# Patient Record
Sex: Male | Born: 1965 | Race: White | Hispanic: No | Smoking: Former smoker
Health system: Southern US, Community
[De-identification: ages and names within clinical notes are randomized; demographics above are authoritative.]

## PROBLEM LIST (undated history)

## (undated) DIAGNOSIS — N5089 Other specified disorders of the male genital organs: Secondary | ICD-10-CM

## (undated) DIAGNOSIS — Z973 Presence of spectacles and contact lenses: Secondary | ICD-10-CM

## (undated) DIAGNOSIS — R519 Headache, unspecified: Secondary | ICD-10-CM

## (undated) DIAGNOSIS — G4730 Sleep apnea, unspecified: Secondary | ICD-10-CM

## (undated) DIAGNOSIS — R531 Weakness: Secondary | ICD-10-CM

## (undated) DIAGNOSIS — R51 Headache: Secondary | ICD-10-CM

## (undated) DIAGNOSIS — C801 Malignant (primary) neoplasm, unspecified: Secondary | ICD-10-CM

## (undated) DIAGNOSIS — R011 Cardiac murmur, unspecified: Secondary | ICD-10-CM

## (undated) HISTORY — PX: SHOULDER ARTHROSCOPY W/ ACROMIAL REPAIR: SUR94

## (undated) HISTORY — PX: CERVICAL SPINE SURGERY: SHX589

---

## 2004-06-21 ENCOUNTER — Emergency Department (HOSPITAL_COMMUNITY): Admission: EM | Admit: 2004-06-21 | Discharge: 2004-06-21 | Payer: Self-pay | Admitting: Emergency Medicine

## 2004-06-23 ENCOUNTER — Emergency Department (HOSPITAL_COMMUNITY): Admission: EM | Admit: 2004-06-23 | Discharge: 2004-06-23 | Payer: Self-pay | Admitting: Family Medicine

## 2004-08-05 ENCOUNTER — Emergency Department (HOSPITAL_COMMUNITY): Admission: EM | Admit: 2004-08-05 | Discharge: 2004-08-05 | Payer: Self-pay | Admitting: Family Medicine

## 2005-09-16 ENCOUNTER — Ambulatory Visit (HOSPITAL_COMMUNITY): Admission: RE | Admit: 2005-09-16 | Discharge: 2005-09-16 | Payer: Self-pay | Admitting: Orthopedic Surgery

## 2007-07-09 ENCOUNTER — Emergency Department (HOSPITAL_COMMUNITY): Admission: EM | Admit: 2007-07-09 | Discharge: 2007-07-10 | Payer: Self-pay | Admitting: Emergency Medicine

## 2008-05-15 ENCOUNTER — Ambulatory Visit (HOSPITAL_COMMUNITY): Admission: RE | Admit: 2008-05-15 | Discharge: 2008-05-15 | Payer: Self-pay | Admitting: Neurosurgery

## 2008-07-18 ENCOUNTER — Ambulatory Visit (HOSPITAL_BASED_OUTPATIENT_CLINIC_OR_DEPARTMENT_OTHER): Admission: RE | Admit: 2008-07-18 | Discharge: 2008-07-18 | Payer: Self-pay | Admitting: Orthopedic Surgery

## 2009-02-02 ENCOUNTER — Encounter: Admission: RE | Admit: 2009-02-02 | Discharge: 2009-02-02 | Payer: Self-pay | Admitting: Family Medicine

## 2009-02-19 ENCOUNTER — Encounter: Admission: RE | Admit: 2009-02-19 | Discharge: 2009-02-19 | Payer: Self-pay | Admitting: Otolaryngology

## 2010-07-26 ENCOUNTER — Emergency Department (HOSPITAL_COMMUNITY): Payer: BC Managed Care – PPO

## 2010-07-26 ENCOUNTER — Emergency Department (HOSPITAL_COMMUNITY)
Admission: EM | Admit: 2010-07-26 | Discharge: 2010-07-27 | Disposition: A | Discharge status: Other Acute Inpatient Hospital | Payer: BC Managed Care – PPO | Attending: Emergency Medicine | Admitting: Emergency Medicine

## 2010-07-26 DIAGNOSIS — F1010 Alcohol abuse, uncomplicated: Secondary | ICD-10-CM | POA: Insufficient documentation

## 2010-07-26 DIAGNOSIS — F329 Major depressive disorder, single episode, unspecified: Secondary | ICD-10-CM | POA: Insufficient documentation

## 2010-07-26 DIAGNOSIS — Y9210 Unspecified residential institution as the place of occurrence of the external cause: Secondary | ICD-10-CM | POA: Insufficient documentation

## 2010-07-26 DIAGNOSIS — F3289 Other specified depressive episodes: Secondary | ICD-10-CM | POA: Insufficient documentation

## 2010-07-26 DIAGNOSIS — I10 Essential (primary) hypertension: Secondary | ICD-10-CM | POA: Insufficient documentation

## 2010-07-26 DIAGNOSIS — R296 Repeated falls: Secondary | ICD-10-CM | POA: Insufficient documentation

## 2010-07-26 DIAGNOSIS — M542 Cervicalgia: Secondary | ICD-10-CM | POA: Insufficient documentation

## 2010-07-26 DIAGNOSIS — T424X4A Poisoning by benzodiazepines, undetermined, initial encounter: Secondary | ICD-10-CM | POA: Insufficient documentation

## 2010-07-26 DIAGNOSIS — R51 Headache: Secondary | ICD-10-CM | POA: Insufficient documentation

## 2010-07-26 DIAGNOSIS — T438X2A Poisoning by other psychotropic drugs, intentional self-harm, initial encounter: Secondary | ICD-10-CM | POA: Insufficient documentation

## 2010-07-26 DIAGNOSIS — T43502A Poisoning by unspecified antipsychotics and neuroleptics, intentional self-harm, initial encounter: Secondary | ICD-10-CM | POA: Insufficient documentation

## 2010-07-26 LAB — COMPREHENSIVE METABOLIC PANEL
ALT: 31 U/L (ref 0–53)
CO2: 25 mEq/L (ref 19–32)
Calcium: 9 mg/dL (ref 8.4–10.5)
GFR calc Af Amer: >60 mL/min (ref >60)
GFR calc non Af Amer: >60 mL/min (ref >60)
Glucose, Bld: 103 mg/dL — ABNORMAL HIGH (ref 70–99)
Potassium: 3.4 mEq/L — ABNORMAL LOW (ref 3.5–5.1)
Sodium: 140 mEq/L (ref 135–145)
Total Bilirubin: 0.5 mg/dL (ref 0.3–1.2)

## 2010-07-26 LAB — RAPID URINE DRUG SCREEN, HOSP PERFORMED
Benzodiazepines: POSITIVE — AB
Cocaine: NOT DETECTED
Opiates: NOT DETECTED
Tetrahydrocannabinol: NOT DETECTED

## 2010-07-26 LAB — SALICYLATE LEVEL: Salicylate Lvl: <4 mg/dL (ref 2.8–20.0)

## 2010-07-26 LAB — DIFFERENTIAL
Basophils Relative: 1 % (ref 0–1)
Lymphs Abs: 3.2 10*3/uL (ref 0.7–4.0)
Monocytes Absolute: 0.9 10*3/uL (ref 0.1–1.0)
Monocytes Relative: 9 % (ref 3–12)
Neutro Abs: 6.1 10*3/uL (ref 1.7–7.7)
Neutrophils Relative %: 58 % (ref 43–77)

## 2010-07-26 LAB — CBC
MCH: 29.4 pg (ref 26.0–34.0)
MCHC: 33.4 g/dL (ref 30.0–36.0)
RDW: 12.8 % (ref 11.5–15.5)
WBC: 10.5 10*3/uL (ref 4.0–10.5)

## 2010-07-26 LAB — URINALYSIS, ROUTINE W REFLEX MICROSCOPIC: Specific Gravity, Urine: 1.007 (ref 1.005–1.030)

## 2010-07-27 ENCOUNTER — Inpatient Hospital Stay (HOSPITAL_COMMUNITY)
Admission: EM | Admit: 2010-07-27 | Discharge: 2010-07-30 | DRG: 427 | Disposition: A | Discharge status: Home / Self Care | Payer: BC Managed Care – PPO | Attending: Psychiatry | Admitting: Psychiatry

## 2010-07-27 DIAGNOSIS — Z880 Allergy status to penicillin: Secondary | ICD-10-CM

## 2010-07-27 DIAGNOSIS — F1010 Alcohol abuse, uncomplicated: Secondary | ICD-10-CM

## 2010-07-27 DIAGNOSIS — G8929 Other chronic pain: Secondary | ICD-10-CM

## 2010-07-27 DIAGNOSIS — F609 Personality disorder, unspecified: Secondary | ICD-10-CM

## 2010-07-27 DIAGNOSIS — F4325 Adjustment disorder with mixed disturbance of emotions and conduct: Principal | ICD-10-CM

## 2010-07-27 DIAGNOSIS — Z6379 Other stressful life events affecting family and household: Secondary | ICD-10-CM

## 2010-07-27 DIAGNOSIS — K219 Gastro-esophageal reflux disease without esophagitis: Secondary | ICD-10-CM

## 2010-07-27 DIAGNOSIS — I10 Essential (primary) hypertension: Secondary | ICD-10-CM

## 2010-07-27 DIAGNOSIS — Z91199 Patient's noncompliance with other medical treatment and regimen due to unspecified reason: Secondary | ICD-10-CM

## 2010-07-27 DIAGNOSIS — T424X4A Poisoning by benzodiazepines, undetermined, initial encounter: Secondary | ICD-10-CM

## 2010-07-27 DIAGNOSIS — G4700 Insomnia, unspecified: Secondary | ICD-10-CM

## 2010-07-27 DIAGNOSIS — F1310 Sedative, hypnotic or anxiolytic abuse, uncomplicated: Secondary | ICD-10-CM

## 2010-07-27 DIAGNOSIS — T43502A Poisoning by unspecified antipsychotics and neuroleptics, intentional self-harm, initial encounter: Secondary | ICD-10-CM

## 2010-07-27 DIAGNOSIS — Z9119 Patient's noncompliance with other medical treatment and regimen: Secondary | ICD-10-CM

## 2010-07-27 DIAGNOSIS — F4320 Adjustment disorder, unspecified: Secondary | ICD-10-CM

## 2010-07-29 NOTE — H&P (Signed)
[name], [name]                 ACCOUNT NO.:  [id number]  MEDICAL RECORD NO.:  [id number]           PATIENT TYPE:  I  LOCATION:  0304                          [facility]ILITY[facility]  [facility]  PHYSICIAN:  Syed T. Arfeen, M.D.   DATE OF BIRTH:  07/19/1965  DATE OF ADMISSION:  07/27/2010 DATE OF DISCHARGE:                      PSYCHIATRIC ADMISSION ASSESSMENT   This is an involuntary admission to the services of Dr. Geralyn Flash. This is a 45 year old separated white male.  The commitment papers indicate that he is going through separation.  He has recently been written up at work.  He called his wife on July 26, 2010, to say a goodnight to her and her children and he told her he had taken a bunch of pills.  She called EMS and police.  He states he just wanted to "sleep" and has a prior admission for depression.  Today he was seen in conjunction with Dr. Lolly Mustache.  He is requesting discharge.  He states that he took 10 Ativan 2 mg although in the emergency room he admitted taking 20 tabs.  He states "I just wanted to sleep."  He states that he has always had a problem sleeping, that he has had a variety of treatments, none of which really help.  Two weeks ago he became more irritable and depressed as he was written up at work.  This resulted in financial issues and so overall he was just feeling very sorry for himself.  He states that approximately 5 or 6 years ago he did experience suicidal ideation.  He and his wife were arguing.  He was fearful that he would not be able to see his children.  He scratched on his wrists with a butter knife.  He was seen at a hospital in [facility].  Alcohol was involved at that time as well.  He was released on the promise that he would seek treatment.  He has done that.  He has been in outpatient care with Dr. Evelene Croon although he currently is not on any antidepressants.  PAST PSYCHIATRIC HISTORY:  As already stated, 1 overnight stay in 2008 at [facility],  alcohol and detox.  He does see Dr. Milagros Evener on an outpatient basis.  Currently he has an upcoming appointment August 01, 2010.  SOCIAL HISTORY:  He received his BS in information management in 1989. He has been married the 1 time.  He has a son, 7, a daughter, 4.  He is employed at [employer].  FAMILY HISTORY:  His states his father is an alcoholic.  ALCOHOL AND DRUG HISTORY:  He has used alcohol since age 68.  He currently acknowledges two 40-ounce beers biweekly.  PRIMARY CARE:  [facility].  He is followed by Dr. Evelene Croon.  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  He has had elevated blood pressure readings in the past but due to a 30-pound weight loss he does not require any medication.  CURRENT MEDICATIONS: 1. He was prescribed Ambien and Ativan by urgent medical care a couple     weeks ago.  This would be 10 mg at  h.s.. 2. Lorazepam 2 mg, unknown sig on that. 3. He has also been taking Clomid 50 mg p.o. q. day to help with his     low T. 4. He has taken in the past trazodone, Nuvigil, Klonopin. 5. He has had multiple antidepressant trials including Lexapro,     Wellbutrin, Zoloft, Paxil. 6. He is also status post 3 sleep studies.  Apparently he does not     enter the 3rd and 4th stage of sleep and he was given a CPAP;     however, he is noncompliant. 7. He also uses Propecia.  We will have to check the dosage on that.  DRUG ALLERGIES:  PENICILLIN.  POSITIVE PHYSICAL FINDINGS:  Well-developed, well-nourished male who appears his stated age of 48.  He was afebrile 97.8.  Blood pressure was 129/66 to 133/67.  Pulse was 86 to 110.  Respirations 16 to 18.  His alcohol level was 89.  His UDS was positive for benzodiazepines.  His hemoglobin and hematocrit were high at 17.9 and 51.8.  His potassium was slightly low at 3.4 and his glucose was slightly elevated at 103.  He had no other remarkable findings.  MENTAL STATUS EXAM:  Again, he was seen in  conjunction with Dr. Lolly Mustache and he was alert and oriented.  He was dressed in his own clothing.  He was up in his room.  He had poor eye contact.  His speech was soft. Attention and concentration were poor.  He denies any active suicidal ideation but he had limited insight and judgment.  He was not psychotic. There was no evidence of any psychosis and no evidence for alcohol withdrawal.  DIAGNOSES:  AXIS I:  Adjustment reaction with mixed reaction of emotions and conduct to being written up 2 weeks ago, insomnia which is chronic, alcohol abuse. AXIS II:  Rule out personality disorder. AXIS III: 1. Low T, has been under treatment for 6 months including steroids in     the past. 2. 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 3. He has been known to have elevated blood pressures, however, since     losing weight he does not require any p.o. medications. AXIS IV:  Problems with primary support group, occupational group, and economic issues. AXIS V:  45.  PLAN:  Adjust his medications as indicated.  Toward that end, we started some Remeron 7.5 mg at h.s. to be repeated x1 if not asleep.  He was also empirically started on the low-dose Librium protocol although he probably does not actually need this.  We will set up a session with his wife as he is insistent about leaving.  He is quite concerned about his job on Monday and he does have an upcoming appointment with Dr. Evelene Croon on Monday.  ESTIMATED LENGTH OF STAY:  Minimally 48 hours.     Mickie Deery Adams, P.A.-C.   ______________________________ Phillips Grout. Lolly Mustache, M.D.    MD/MEDQ  D:  07/27/2010  T:  07/27/2010  Job:  [id number]  Electronically Signed by Jaci Lazier ADAMS P.A.-C. on 07/28/2010 01:59:22 PM Electronically Signed by Kathryne Sharper M.D. on 07/29/2010 09:35:33 AM

## 2010-08-01 NOTE — Discharge Summary (Signed)
**Note [name]** [name], [name] NO.:  [id number]  MEDICAL RECORD NO.:  [id number]           PATIENT TYPE:  I  LOCATION:  0304                          [facility]ACILITY[facility]  [facility]  PHYSICIAN:  Anselm Jungling, MD  DATE OF BIRTH:  30-Mar-1966  DATE OF ADMISSION:  07/27/2010 DATE OF DISCHARGE:  07/30/2010                              DISCHARGE SUMMARY   IDENTIFYING DATA AND REASON FOR ADMISSION:  This was an inpatient psychiatric admission, his first ever, for [name], a 45 year old divorced Caucasian male, who was admitted because of concern about suicide risk and substance abuse.  Please refer to the admission note for further details pertaining to the symptoms, circumstances and history that led to his hospitalization.  He was given an initial Axis I diagnosis of substance abuse NOS and rule out depressive disorder NOS.  MEDICAL AND LABORATORY:  The patient was medically and physically assessed by the psychiatric nurse practitioner.  He was in good health without any active or chronic medical problems.  There were no significant medical issues during his hospital stay.  HOSPITAL COURSE:  The patient was admitted to the adult inpatient psychiatric service.  He presented as a well-nourished, normally- developed adult male of above-average intelligence and education.  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  He denied any problems with substance abuse or depression although he did indicate quite clearly that he felt that he was experiencing a lot of stress in relation to his family issues, his work load, and his responsibilities as a father.  He denied any alcohol abuse although he admitted to getting together with friends once or twice a week after work for sessions of drinking; however, he did not believe that he was a heavy drinker or had an alcohol abuse problem.  He reported that he was a patient of Dr. Evelene Croon, who, in  fact, he had an appointment to see on March 8.  He had been seeing Dr. Evelene Croon for treatment of chronic and severe insomnia.  He reported that Dr. Evelene Croon had previously prescribed for him trazodone, but it had stopped being effective.  When he was unable to get an appointment with her soon enough to get a new medication, he went to an urgent care center, where he got Ativan and Ambien.  He subsequently took too many of these medications on the weekend of his admission to [facility].  He reports that he was simply trying to ensure that he would get some extra sleep given it was one of the weekends that he did not have custody of his children and was not working.  Apparently a friend became aware of what he had ingested, felt that it might have represented a suicide attempt, and at the very least that the patient was in medical jeopardy.  Later we learned, after talking to the patient's ex-wife with his permission, that he had given his ex-wife a good-bye message that indicated the possibility of some suicide planning.  During his stay with [place], the patient consistently denied that his medication-taking  had been any form of attempted self-harm or suicide, instead, simply a way for him to assure himself that he could get to sleep, which he felt that he desperately needed in order to deal with his overall stress and life.  He denied any suicidal ideation whatsoever consistently through his 3- day hospital stay.  He also continued to deny any substance abuse issues.  Remeron 7.5 mg q.h.s. was given for sleep.  It was not very helpful. Although Librium was ordered on a p.r.n. basis for emergence of any withdrawal symptoms, he turned down offers of Librium doses and did not seem to develop any withdrawal symptoms.  He requested discharge on the second hospital day, but, after receiving the above-referenced information regarding a good-bye note, caution needed to be exercised regarding his  evaluation and release.  The undersigned attempted to call Dr. Evelene Croon on March 5, but I did not get a call back.  On the fourth hospital day, the patient continued to request discharge. He had consistently been denying any suicidal ideation as well as any ongoing substance abuse issues.  There were not any withdrawal symptoms emerging that would suggest that he was chemically dependent.  There did not appear to be a basis for continuing his stay beyond his wishes.  He indicated that he very much wanted to follow up with Dr. Evelene Croon on March 8, the second day after his discharge.  He agreed to the following aftercare plan.  AFTERCARE:  The patient was to follow up with Dr. Evelene Croon on March 8.  DISCHARGE MEDICATIONS:  Remeron 15 mg q.h.s.  The patient was advised to not consume any alcohol.  Although he had been taking Remeron 7.5 mg during his stay with [place], it had not appeared to be very effective, and it was explained to the patient that for this reason Remeron would be increased to 15 mg q.h.s.  He would take this at home for 2 nights, and then review with Dr. Evelene Croon on March 8.  DISCHARGE DIAGNOSES:  AXIS I:  Adjustment disorder NOS. AXIS II:  Deferred. AXIS III:  No acute or chronic illnesses. AXIS IV:  Stressors severe. AXIS V:  GAF on discharge 55.     Anselm Jungling, MD     SPB/MEDQ  D:  07/30/2010  T:  07/30/2010  Job:  [id number]  Electronically Signed by Geralyn Flash MD on 07/31/2010 09:38:53 AM

## 2010-09-10 LAB — POCT HEMOGLOBIN-HEMACUE: Hemoglobin: 16.6 g/dL (ref 13.0–17.0)

## 2010-10-08 NOTE — Op Note (Signed)
[name], [name]                 ACCOUNT NO.:  [id number]   MEDICAL RECORD NO.:  [id number]          PATIENT TYPE:  AMB   LOCATION:  NESC                         [facility]ACILITY:  [facility]   PHYSICIAN:  Marlowe Kays, M.D.  DATE OF BIRTH:  1966-01-09   DATE OF PROCEDURE:  07/18/2008  DATE OF DISCHARGE:                               OPERATIVE REPORT   PREOPERATIVE DIAGNOSIS:  Chronic impingement syndrome, left shoulder.   POSTOPERATIVE DIAGNOSIS:  Chronic impingement syndrome, left shoulder.   OPERATION:  Left shoulder arthroscopy with normal glenohumeral joint,  and arthroscopic subacromial decompression.   SURGEON:  Marlowe Kays, M.D.   ASSISTANTDruscilla Brownie. Idolina Primer, PA-C.   ANESTHESIA:  General.   PATHOLOGY AND INDICATIONS FOR PROCEDURE:  He had a prior right shoulder  arthroscopy several years ago and done well.  He has chronic pain with  impingement-type symptoms in the left shoulder.  The MRI demonstrating  no frank rotator cuff tear but rotator cuff irritation.  Consequently he  is here today for the above-mentioned surgery.   DESCRIPTION OF PROCEDURE:  Under satisfactory general anesthesia in the  beach-chair position on the sliding frame, the left shoulder girdle was  prepped with DuraPrep, draped in a sterile field, and the shoulder joint  was marked out.  A time-out was performed.  I injected the lateral and  posterior portal sites in the subacromial space and through the  posterior portal atraumatically entered the glenohumeral joint, which  was normal on examination.  Representative pictures were taken.  I then  redirected the scope into the subacromial space and through the  anterolateral portal placed a 4.2 shaver.  He had a good bit of bursitis  present and a marked impingement problem.  After clearing out the  bursitis with the shaver, I used the 90-degree ArthroCare vaporizer to  begin removing soft tissue from the undersurface of the acromion and  followed  this with a 4-mm oval bur, burring down bone.  I then worked  back and forth between the 3 instruments.  I gradually removed more soft  tissue and bone until we had a wide decompression, documented with film,  with his arm to his side and his arm abducted.  The rotator cuff was  somewhat irritated but there was no frank tear noted.  When I felt the  decompression had been completed, I removed all fluid possible from the  subacromial space and infiltrated the 2 portals and the subacromial  space once again with 0.5% Marcaine with 0.25% adrenaline.  The 2  portals were closed with 4-0 nylon.  Betadine and an Adaptic pressure  dressing and a shoulder immobilizer were applied.  He tolerated the  procedure well and was taken to recovery in satisfactory condition with  no known complications.          ______________________________  Marlowe Kays, M.D.    JA/MEDQ  D:  07/18/2008  T:  07/19/2008  Job:  [id number]

## 2010-10-08 NOTE — Op Note (Signed)
NAMEJULLIEN, [name]                 ACCOUNT NO.:  [id number]   MEDICAL RECORD NO.:  [id number]          PATIENT TYPE:  INP   LOCATION:  3535                         FACILITY:  [facility]   PHYSICIAN:  Kathaleen Maser. Pool, M.D.    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   PROCEDURE NAME:  C6-7 anterior cervical diskectomy with Prestige  interbody arthroplasty.   SURGEON:  Kathaleen Maser. Pool, M.D.   ASSISTANT:  Donalee Citrin, M.D.   ANESTHESIA:  General oroendotracheal.   PREMEDICATION:  [name] is a 45 year old male with history of neck  and left upper extremity pain consistent with left-sided C7  radiculopathy failing conservative management.  Workup demonstrates  evidence of a left-sided C6-7 disk herniation with marked compression  and left-sided C7 nerve root.  The patient has been counseled as to his  options.  He decided to proceed with a C6-7 anterior cervical diskectomy  with interbody arthroplasty.   OPERATIVE NOTE:  Patient was taken to the operative room and placed in a  supine position.  After an adequate level of anesthesia was achieved,  the patient was positioned supine with neck slightly extended, held in  place with halter traction.  The patient's anterior and cervical regions  prepped and draped sterilely.  A 10 blade was used to make a linear  incision overlying the C6-7 vertebral level.  This was carried down  sharply to the platysma.  Platysma was then divided vertically and  dissection proceeded along medial border of the sternocleidomastoid  muscle and carotid sheath.  The trachea and esophagus were mobilized and  retracted towards the left.  Prevertebral fascia was stripped off the  anterior spinal  column.  Longus coli muscle was elevated bilaterally  using electrocautery.  Deep self-retaining retractor was placed.  Intraoperative x-rays taken.  Level was confirmed.  The disk space at C6-  7 was then incised with 15 blade in rectangular fashion.  Wide disk  space was then achieved using pituitary rongeurs, forward and backward  Carlen curettes, Kerrison rongeurs, and high-speed drill.  All elements  of the disk were removed down to the level of the posterior annulus.  Microscope was brought into the field and used throughout the remainder  of the diskectomy.  The remaining aspects of the annulus and osteophytes  were removed using high-speed drill down to the level of the posterior  longitudinal ligament.  Posterior longitudinal ligament was elevated and  resected in usual fashion using Kerrison rongeur.  The underlying thecal  sac was identified.  Wide central compression was then performed by  undercutting the bodies of C6-C7.  Decompression then proceeded out each  neural foramen.  Wide anterior foraminotomies were then performed along  course of exiting C6-7 nerve roots bilaterally.  A large amount of free  disk herniation encountered off to the left  side.  This was completely  resected.  At this point, a very thorough decompression had been  achieved.  This was no injury to the thecal sac or nerve roots.  Wound  was then irrigated with antibiotic solution.  The disk space was then  sized and found to be appropriate for a 7 x 14-mm implant.  This was  then packed into place and then secured with 13-mm screws under  fluoroscopic guidance.  All 4 screws were given a final tightening.  The  implant insertion tool was removed.  Locking screws were engaged at both  levels.  Final images revealed good position of the hardware with normal  alignment laterally and with the AP plane.  The wound was then irrigated  with antibiotic solution.  Hemostasis was achieved with  bipolar  electrocautery.  Wound was then closed in layers with Vicryl  suture.  Steri-Strips and sterile dressing were applied.  There were no  complications.  Patient tolerated the procedure well, and he returned to  recovery room for postoperative care.           ______________________________  Kathaleen Maser. Pool, M.D.     HAP/MEDQ  D:  05/15/2008  T:  05/16/2008  Job:  [id number]

## 2010-10-11 NOTE — Op Note (Signed)
[name], Kyle                 ACCOUNT NO.:  [id number]   MEDICAL RECORD NO.:  [id number]          PATIENT TYPE:  AMB   LOCATION:  DAY                          FACILITY:  [facility]   PHYSICIAN:  Marlowe Kays, M.D.  DATE OF BIRTH:  1965-11-15   DATE OF PROCEDURE:  09/16/2005  DATE OF DISCHARGE:                                 OPERATIVE REPORT   PREOPERATIVE DIAGNOSIS:  Chronic impingement syndrome with rotator cuff  tendinopathy, right shoulder.   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   OPERATION:  Right shoulder arthroscopy with (normal examination) and  arthroscopic subacromial decompression.   SURGEON:  Marlowe Kays, M.D.   ASSISTANTDruscilla Brownie. Idolina Primer, P.A.-C.   ANESTHESIA:  General anesthesia.   INDICATIONS FOR PROCEDURE:  Chronic pain right shoulder refractory to  nonsurgical treatment.  MRI has demonstrated a downsloping acromion, some  degenerative change at the [facility] joint which is nontender on examination and  rotator cuff tendinopathy.  Accordingly he is here today for the above-  mentioned surgery.   PROCEDURE:  Satisfactory general anesthesia, beach-chair position on the  Allen frame, right shoulder girdle was prepped with DuraPrep and draped as a  sterile field.  Anatomy of the shoulder joint was marked out and the  subacromial space posterior and lateral portals were infiltrated with 0.5%  Marcaine with adrenalin.  Through a posterior soft spot portal, I  atraumatically entered the glenohumeral joint which was normal on  examination.  Representative pictures were taken.  I then redirected the  scope in the subacromial space and through a lateral portal, introduced a  4.2 shaver.  He had a very flagrant bursitis which I resected.  I then  reduced the 90 degree ArthroCare vaporizer removing some residual bursal  tissue but mainly the soft tissue from the undersurface of the acromion.  I  followed this with the 4 mm bur and began burring down the undersurface of  the acromion until we had a wide decompression.  I went back-and-forth doing  this between the vaporizer and the bur removing all soft tissue.  At the  inclusion of the decompression I took final pictures with his arm to his  side and the arm abducted with vaporizer in place for documentation.  I then  removed all fluid possible from the subacromial space and infiltrated the  two portals and the subacromial space once again with 0.5% Marcaine with  adrenalin.  Betadine, Adaptic, dry sterile dressing were applied followed by  a shoulder immobilizer.  He tolerated the procedure well and was taken to  the recovery room in satisfactory condition with no known complications.           ______________________________  Marlowe Kays, M.D.     JA/MEDQ  D:  09/16/2005  T:  09/17/2005  Job:  [id number]

## 2011-02-14 LAB — I-STAT 8, (EC8 V) (CONVERTED LAB)
Acid-base deficit: 1
BUN: 8
Bicarbonate: 25.4 — ABNORMAL HIGH
HCT: 46
Potassium: 4.4
Sodium: 138
pCO2, Ven: 49.5
pH, Ven: 7.319 — ABNORMAL HIGH

## 2011-02-14 LAB — CBC
RBC: 4.85
WBC: 14.9 — ABNORMAL HIGH

## 2011-02-14 LAB — DIFFERENTIAL
Lymphocytes Relative: 11 — ABNORMAL LOW
Monocytes Absolute: 0.6
Neutrophils Relative %: 84 — ABNORMAL HIGH

## 2011-02-14 LAB — POCT I-STAT CREATININE: Creatinine, Ser: 1.2

## 2011-02-14 LAB — ETHANOL: Alcohol, Ethyl (B): 218 — ABNORMAL HIGH

## 2011-02-28 LAB — DIFFERENTIAL
Basophils Absolute: 0 10*3/uL (ref 0.0–0.1)
Eosinophils Absolute: 0.3 10*3/uL (ref 0.0–0.7)
Monocytes Absolute: 0.8 10*3/uL (ref 0.1–1.0)
Neutro Abs: 5 10*3/uL (ref 1.7–7.7)

## 2011-02-28 LAB — CBC
Hemoglobin: 16.2 g/dL (ref 13.0–17.0)
MCHC: 33.8 g/dL (ref 30.0–36.0)
MCV: 86.7 fL (ref 78.0–100.0)
Platelets: 156 10*3/uL (ref 150–400)
RDW: 13.4 % (ref 11.5–15.5)

## 2011-03-26 ENCOUNTER — Emergency Department (HOSPITAL_COMMUNITY): Payer: BC Managed Care – PPO

## 2011-03-26 ENCOUNTER — Emergency Department (HOSPITAL_COMMUNITY)
Admission: EM | Admit: 2011-03-26 | Discharge: 2011-03-26 | Disposition: A | Discharge status: Home / Self Care | Payer: BC Managed Care – PPO | Attending: Emergency Medicine | Admitting: Emergency Medicine

## 2011-03-26 DIAGNOSIS — R0602 Shortness of breath: Secondary | ICD-10-CM | POA: Insufficient documentation

## 2011-03-26 DIAGNOSIS — R000 Tachycardia, unspecified: Secondary | ICD-10-CM | POA: Insufficient documentation

## 2011-03-26 DIAGNOSIS — F3289 Other specified depressive episodes: Secondary | ICD-10-CM | POA: Insufficient documentation

## 2011-03-26 DIAGNOSIS — R071 Chest pain on breathing: Secondary | ICD-10-CM | POA: Insufficient documentation

## 2011-03-26 DIAGNOSIS — R799 Abnormal finding of blood chemistry, unspecified: Secondary | ICD-10-CM | POA: Insufficient documentation

## 2011-03-26 DIAGNOSIS — R0609 Other forms of dyspnea: Secondary | ICD-10-CM | POA: Insufficient documentation

## 2011-03-26 DIAGNOSIS — F329 Major depressive disorder, single episode, unspecified: Secondary | ICD-10-CM | POA: Insufficient documentation

## 2011-03-26 DIAGNOSIS — R0989 Other specified symptoms and signs involving the circulatory and respiratory systems: Secondary | ICD-10-CM | POA: Insufficient documentation

## 2011-03-26 DIAGNOSIS — I10 Essential (primary) hypertension: Secondary | ICD-10-CM | POA: Insufficient documentation

## 2011-03-26 LAB — POCT I-STAT, CHEM 8
Calcium, Ion: 1.2 mmol/L (ref 1.12–1.32)
Glucose, Bld: 160 mg/dL — ABNORMAL HIGH (ref 70–99)
HCT: 54 % — ABNORMAL HIGH (ref 39.0–52.0)
Hemoglobin: 18.4 g/dL — ABNORMAL HIGH (ref 13.0–17.0)

## 2011-03-26 MED ORDER — IOHEXOL 300 MG/ML  SOLN
70.0000 mL | Freq: Once | INTRAMUSCULAR | Status: AC | PRN
  Administered 2011-03-26: 70 mL via INTRAVENOUS

## 2012-05-11 ENCOUNTER — Other Ambulatory Visit: Payer: Self-pay | Admitting: Orthopedic Surgery

## 2012-05-11 DIAGNOSIS — M8430XA Stress fracture, unspecified site, initial encounter for fracture: Secondary | ICD-10-CM

## 2012-05-14 ENCOUNTER — Other Ambulatory Visit: Payer: BC Managed Care – PPO

## 2012-05-21 ENCOUNTER — Ambulatory Visit
Admission: RE | Admit: 2012-05-21 | Discharge: 2012-05-21 | Disposition: A | Discharge status: Home / Self Care | Payer: BC Managed Care – PPO | Source: Ambulatory Visit | Attending: Orthopedic Surgery | Admitting: Orthopedic Surgery

## 2012-05-21 DIAGNOSIS — M8430XA Stress fracture, unspecified site, initial encounter for fracture: Secondary | ICD-10-CM

## 2014-02-15 ENCOUNTER — Other Ambulatory Visit: Payer: Self-pay | Admitting: Family Medicine

## 2014-02-15 DIAGNOSIS — R109 Unspecified abdominal pain: Secondary | ICD-10-CM

## 2014-02-17 ENCOUNTER — Ambulatory Visit
Admission: RE | Admit: 2014-02-17 | Discharge: 2014-02-17 | Disposition: A | Discharge status: Home / Self Care | Payer: BC Managed Care – PPO | Source: Ambulatory Visit | Attending: Family Medicine | Admitting: Family Medicine

## 2014-02-17 DIAGNOSIS — R109 Unspecified abdominal pain: Secondary | ICD-10-CM

## 2014-02-27 ENCOUNTER — Other Ambulatory Visit (HOSPITAL_COMMUNITY): Payer: Self-pay | Admitting: Family Medicine

## 2014-02-27 DIAGNOSIS — R1013 Epigastric pain: Secondary | ICD-10-CM

## 2014-02-27 DIAGNOSIS — R109 Unspecified abdominal pain: Secondary | ICD-10-CM

## 2014-03-02 ENCOUNTER — Ambulatory Visit (HOSPITAL_COMMUNITY)
Admission: RE | Admit: 2014-03-02 | Discharge: 2014-03-02 | Disposition: A | Discharge status: Home / Self Care | Payer: BC Managed Care – PPO | Source: Ambulatory Visit | Attending: Family Medicine | Admitting: Family Medicine

## 2014-03-02 DIAGNOSIS — K219 Gastro-esophageal reflux disease without esophagitis: Secondary | ICD-10-CM | POA: Insufficient documentation

## 2014-03-02 DIAGNOSIS — R1011 Right upper quadrant pain: Secondary | ICD-10-CM | POA: Insufficient documentation

## 2014-03-02 DIAGNOSIS — R1012 Left upper quadrant pain: Secondary | ICD-10-CM | POA: Diagnosis not present

## 2014-03-02 DIAGNOSIS — R1013 Epigastric pain: Secondary | ICD-10-CM | POA: Diagnosis not present

## 2014-03-02 DIAGNOSIS — R109 Unspecified abdominal pain: Secondary | ICD-10-CM

## 2014-03-02 MED ORDER — SINCALIDE 5 MCG IJ SOLR
INTRAMUSCULAR | Status: AC
  Administered 2014-03-02: 1.58 ug via INTRAVENOUS
  Filled 2014-03-02: qty 5

## 2014-03-02 MED ORDER — TECHNETIUM TC 99M MEBROFENIN IV KIT
5.0000 | PACK | Freq: Once | INTRAVENOUS | Status: AC | PRN
  Administered 2014-03-02: 5 via INTRAVENOUS

## 2014-03-02 MED ORDER — SINCALIDE 5 MCG IJ SOLR
0.0200 ug/kg | Freq: Once | INTRAMUSCULAR | Status: AC
  Administered 2014-03-02: 1.58 ug via INTRAVENOUS

## 2014-03-02 MED ORDER — STERILE WATER FOR INJECTION IJ SOLN
INTRAMUSCULAR | Status: AC
  Filled 2014-03-02: qty 10

## 2014-07-20 ENCOUNTER — Other Ambulatory Visit: Payer: Self-pay | Admitting: Urology

## 2014-07-27 ENCOUNTER — Encounter (HOSPITAL_BASED_OUTPATIENT_CLINIC_OR_DEPARTMENT_OTHER): Payer: Self-pay | Admitting: *Deleted

## 2014-07-27 NOTE — Progress Notes (Signed)
Pt instructed npo pmn3/7 x brintellix and propecia w sip of water.  To [name] 3/8 @ 0700.  Needs hgb on arrival.  Pt to work on getting responsible adult to stay w him overnight Tuesday .

## 2014-08-01 ENCOUNTER — Ambulatory Visit (HOSPITAL_BASED_OUTPATIENT_CLINIC_OR_DEPARTMENT_OTHER): Payer: BLUE CROSS/BLUE SHIELD | Admitting: Anesthesiology

## 2014-08-01 ENCOUNTER — Encounter (HOSPITAL_BASED_OUTPATIENT_CLINIC_OR_DEPARTMENT_OTHER)
Admission: RE | Disposition: A | Discharge status: Home / Self Care | Payer: Self-pay | Source: Ambulatory Visit | Attending: Urology

## 2014-08-01 ENCOUNTER — Ambulatory Visit (HOSPITAL_BASED_OUTPATIENT_CLINIC_OR_DEPARTMENT_OTHER)
Admission: RE | Admit: 2014-08-01 | Discharge: 2014-08-01 | Disposition: A | Discharge status: Home / Self Care | Payer: BLUE CROSS/BLUE SHIELD | Source: Ambulatory Visit | Attending: Urology | Admitting: Urology

## 2014-08-01 ENCOUNTER — Ambulatory Visit (HOSPITAL_COMMUNITY): Payer: BLUE CROSS/BLUE SHIELD

## 2014-08-01 ENCOUNTER — Encounter (HOSPITAL_BASED_OUTPATIENT_CLINIC_OR_DEPARTMENT_OTHER): Payer: Self-pay | Admitting: *Deleted

## 2014-08-01 DIAGNOSIS — C6291 Malignant neoplasm of right testis, unspecified whether descended or undescended: Secondary | ICD-10-CM | POA: Insufficient documentation

## 2014-08-01 DIAGNOSIS — Z87891 Personal history of nicotine dependence: Secondary | ICD-10-CM | POA: Insufficient documentation

## 2014-08-01 DIAGNOSIS — Z79899 Other long term (current) drug therapy: Secondary | ICD-10-CM | POA: Diagnosis not present

## 2014-08-01 DIAGNOSIS — N529 Male erectile dysfunction, unspecified: Secondary | ICD-10-CM | POA: Insufficient documentation

## 2014-08-01 DIAGNOSIS — Z01818 Encounter for other preprocedural examination: Secondary | ICD-10-CM

## 2014-08-01 DIAGNOSIS — G4730 Sleep apnea, unspecified: Secondary | ICD-10-CM | POA: Diagnosis not present

## 2014-08-01 DIAGNOSIS — K219 Gastro-esophageal reflux disease without esophagitis: Secondary | ICD-10-CM | POA: Diagnosis not present

## 2014-08-01 DIAGNOSIS — N400 Enlarged prostate without lower urinary tract symptoms: Secondary | ICD-10-CM | POA: Insufficient documentation

## 2014-08-01 DIAGNOSIS — E291 Testicular hypofunction: Secondary | ICD-10-CM | POA: Insufficient documentation

## 2014-08-01 HISTORY — DX: Headache, unspecified: R51.9

## 2014-08-01 HISTORY — DX: Weakness: R53.1

## 2014-08-01 HISTORY — DX: Sleep apnea, unspecified: G47.30

## 2014-08-01 HISTORY — DX: Presence of spectacles and contact lenses: Z97.3

## 2014-08-01 HISTORY — DX: Cardiac murmur, unspecified: R01.1

## 2014-08-01 HISTORY — DX: Other specified disorders of the male genital organs: N50.89

## 2014-08-01 HISTORY — PX: ORCHIECTOMY: SHX2116

## 2014-08-01 HISTORY — DX: Headache: R51

## 2014-08-01 LAB — POCT HEMOGLOBIN-HEMACUE: Hemoglobin: 18.8 g/dL — ABNORMAL HIGH (ref 13.0–17.0)

## 2014-08-01 SURGERY — ORCHIECTOMY
Anesthesia: General | Site: Groin | Laterality: Right

## 2014-08-01 MED ORDER — DEXAMETHASONE SODIUM PHOSPHATE 4 MG/ML IJ SOLN
INTRAMUSCULAR | Status: DC | PRN
  Administered 2014-08-01: 10 mg via INTRAVENOUS

## 2014-08-01 MED ORDER — SULFAMETHOXAZOLE-TRIMETHOPRIM 800-160 MG PO TABS: 1.0000 | ORAL_TABLET | Freq: Two times a day (BID) | ORAL | Status: DC

## 2014-08-01 MED ORDER — PROPOFOL 10 MG/ML IV BOLUS
INTRAVENOUS | Status: DC | PRN
  Administered 2014-08-01: 200 mg via INTRAVENOUS

## 2014-08-01 MED ORDER — ACETAMINOPHEN 325 MG PO TABS
650.0000 mg | ORAL_TABLET | ORAL | Status: DC | PRN
  Filled 2014-08-01: qty 2

## 2014-08-01 MED ORDER — GLYCOPYRROLATE 0.2 MG/ML IJ SOLN
INTRAMUSCULAR | Status: DC | PRN
  Administered 2014-08-01: 0.2 mg via INTRAVENOUS

## 2014-08-01 MED ORDER — OXYCODONE HCL 5 MG PO TABS
5.0000 mg | ORAL_TABLET | ORAL | Status: DC | PRN
  Administered 2014-08-01: 5 mg via ORAL
  Filled 2014-08-01: qty 2

## 2014-08-01 MED ORDER — FENTANYL CITRATE 0.05 MG/ML IJ SOLN
25.0000 ug | INTRAMUSCULAR | Status: DC | PRN
  Filled 2014-08-01: qty 1

## 2014-08-01 MED ORDER — LACTATED RINGERS IV SOLN
INTRAVENOUS | Status: DC
Start: 2014-08-01 — End: 2014-08-01
  Filled 2014-08-01: qty 1000

## 2014-08-01 MED ORDER — ACETAMINOPHEN 650 MG RE SUPP
650.0000 mg | RECTAL | Status: DC | PRN
Start: 2014-08-01 — End: 2014-08-01
  Filled 2014-08-01: qty 1

## 2014-08-01 MED ORDER — SODIUM CHLORIDE 0.9 % IJ SOLN
3.0000 mL | Freq: Two times a day (BID) | INTRAMUSCULAR | Status: DC
  Filled 2014-08-01: qty 3

## 2014-08-01 MED ORDER — MIDAZOLAM HCL 5 MG/5ML IJ SOLN
INTRAMUSCULAR | Status: DC | PRN
  Administered 2014-08-01: 2 mg via INTRAVENOUS

## 2014-08-01 MED ORDER — SODIUM CHLORIDE 0.9 % IJ SOLN
INTRAMUSCULAR | Status: DC | PRN
  Administered 2014-08-01: 15 mL

## 2014-08-01 MED ORDER — MIDAZOLAM HCL 2 MG/2ML IJ SOLN
INTRAMUSCULAR | Status: AC
  Filled 2014-08-01: qty 2

## 2014-08-01 MED ORDER — BUPIVACAINE HCL (PF) 0.25 % IJ SOLN
INTRAMUSCULAR | Status: DC | PRN
  Administered 2014-08-01: 14 mL

## 2014-08-01 MED ORDER — GENTAMICIN SULFATE 40 MG/ML IJ SOLN
160.0000 mg | Freq: Once | INTRAVENOUS | Status: AC
  Administered 2014-08-01: 160 mg via INTRAVENOUS
  Filled 2014-08-01: qty 4

## 2014-08-01 MED ORDER — SODIUM CHLORIDE 0.9 % IJ SOLN
3.0000 mL | INTRAMUSCULAR | Status: DC | PRN
  Filled 2014-08-01: qty 3

## 2014-08-01 MED ORDER — SODIUM CHLORIDE 0.9 % IR SOLN
Status: DC | PRN
  Administered 2014-08-01: 500 mL

## 2014-08-01 MED ORDER — FENTANYL CITRATE 0.05 MG/ML IJ SOLN
INTRAMUSCULAR | Status: DC | PRN
  Administered 2014-08-01 (×2): 50 ug via INTRAVENOUS

## 2014-08-01 MED ORDER — SODIUM CHLORIDE 0.9 % IV SOLN
250.0000 mL | INTRAVENOUS | Status: DC | PRN
  Filled 2014-08-01: qty 250

## 2014-08-01 MED ORDER — LIDOCAINE HCL (CARDIAC) 20 MG/ML IV SOLN
INTRAVENOUS | Status: DC | PRN
  Administered 2014-08-01: 100 mg via INTRAVENOUS

## 2014-08-01 MED ORDER — ONDANSETRON HCL 4 MG/2ML IJ SOLN
INTRAMUSCULAR | Status: DC | PRN
  Administered 2014-08-01: 4 mg via INTRAVENOUS

## 2014-08-01 MED ORDER — ACETAMINOPHEN 10 MG/ML IV SOLN
INTRAVENOUS | Status: DC | PRN
  Administered 2014-08-01: 1000 mg via INTRAVENOUS

## 2014-08-01 MED ORDER — CEFAZOLIN SODIUM-DEXTROSE 2-3 GM-% IV SOLR
INTRAVENOUS | Status: AC
  Filled 2014-08-01: qty 50

## 2014-08-01 MED ORDER — HYDROCODONE-ACETAMINOPHEN 5-325 MG PO TABS: 1.0000 | ORAL_TABLET | Freq: Four times a day (QID) | ORAL | Status: DC | PRN

## 2014-08-01 MED ORDER — CEFAZOLIN SODIUM-DEXTROSE 2-3 GM-% IV SOLR
2.0000 g | INTRAVENOUS | Status: AC
  Administered 2014-08-01: 2 g via INTRAVENOUS
  Filled 2014-08-01: qty 50

## 2014-08-01 MED ORDER — OXYCODONE HCL 5 MG PO TABS
ORAL_TABLET | ORAL | Status: AC
  Filled 2014-08-01: qty 1

## 2014-08-01 MED ORDER — FENTANYL CITRATE 0.05 MG/ML IJ SOLN
INTRAMUSCULAR | Status: AC
  Filled 2014-08-01: qty 4

## 2014-08-01 MED ORDER — LACTATED RINGERS IV SOLN
INTRAVENOUS | Status: DC
  Administered 2014-08-01: 08:00:00 via INTRAVENOUS
  Filled 2014-08-01: qty 1000

## 2014-08-01 SURGICAL SUPPLY — 45 items
BLADE CLIPPER SURG (BLADE) ×3 IMPLANT
BLADE SURG 15 STRL LF DISP TIS (BLADE) ×1 IMPLANT
BLADE SURG 15 STRL SS (BLADE) ×3
BNDG GAUZE ELAST 4 BULKY (GAUZE/BANDAGES/DRESSINGS) ×3 IMPLANT
CANISTER SUCTION 1200CC (MISCELLANEOUS) ×3 IMPLANT
CLEANER CAUTERY TIP 5X5 PAD (MISCELLANEOUS) ×1 IMPLANT
COVER MAYO STAND STRL (DRAPES) ×3 IMPLANT
COVER TABLE BACK 60X90 (DRAPES) ×3 IMPLANT
DRAIN PENROSE 18X1/4 LTX STRL (WOUND CARE) ×3 IMPLANT
DRAPE LAPAROTOMY TRNSV 102X78 (DRAPE) ×3 IMPLANT
ELECT REM PT RETURN 9FT ADLT (ELECTROSURGICAL) ×3
ELECTRODE REM PT RTRN 9FT ADLT (ELECTROSURGICAL) ×1 IMPLANT
GLOVE BIO SURGEON STRL SZ 6.5 (GLOVE) ×2 IMPLANT
GLOVE BIO SURGEONS STRL SZ 6.5 (GLOVE) ×1
GLOVE INDICATOR 6.5 STRL GRN (GLOVE) ×3 IMPLANT
GLOVE INDICATOR 7.5 STRL GRN (GLOVE) ×3 IMPLANT
GLOVE SURG SS PI 8.0 STRL IVOR (GLOVE) ×3 IMPLANT
GOWN STRL REUS W/ TWL LRG LVL3 (GOWN DISPOSABLE) ×1 IMPLANT
GOWN STRL REUS W/ TWL XL LVL3 (GOWN DISPOSABLE) ×1 IMPLANT
GOWN STRL REUS W/TWL LRG LVL3 (GOWN DISPOSABLE) ×3
GOWN STRL REUS W/TWL XL LVL3 (GOWN DISPOSABLE) ×3
LIQUID BAND (GAUZE/BANDAGES/DRESSINGS) ×3 IMPLANT
NEEDLE HYPO 22GX1.5 SAFETY (NEEDLE) ×3 IMPLANT
PACK BASIN DAY SURGERY FS (CUSTOM PROCEDURE TRAY) ×3 IMPLANT
PAD CLEANER CAUTERY TIP 5X5 (MISCELLANEOUS) ×2
PENCIL BUTTON HOLSTER BLD 10FT (ELECTRODE) ×3 IMPLANT
PROSTHESIS TESTICULAR NAC LRG (Urological Implant) ×1 IMPLANT
SET COLLECT BLD 21X3/4 12 (NEEDLE) ×3 IMPLANT
SUPPORT SCROTAL LG STRP (MISCELLANEOUS) ×2 IMPLANT
SUPPORTER ATHLETIC LG (MISCELLANEOUS) ×1
SUT CHROMIC 3 0 SH 27 (SUTURE) ×3 IMPLANT
SUT MNCRL AB 4-0 PS2 18 (SUTURE) ×3 IMPLANT
SUT PDS AB 2-0 CT2 27 (SUTURE) ×6 IMPLANT
SUT VIC AB 3-0 SH 27 (SUTURE) ×3
SUT VIC AB 3-0 SH 27X BRD (SUTURE) ×1 IMPLANT
SUT VICRYL 0 TIES 12 18 (SUTURE) ×3 IMPLANT
SYR 20CC LL (SYRINGE) ×3 IMPLANT
SYR BULB IRRIGATION 50ML (SYRINGE) ×3 IMPLANT
SYRINGE CONTROL L 12CC (SYRINGE) ×3 IMPLANT
TESTICULAR PROSTHESIS NAC LRG (Urological Implant) ×3 IMPLANT
TOWEL OR 17X24 6PK STRL BLUE (TOWEL DISPOSABLE) ×6 IMPLANT
TRAY DSU PREP LF (CUSTOM PROCEDURE TRAY) ×3 IMPLANT
TUBE CONNECTING 12'X1/4 (SUCTIONS) ×1
TUBE CONNECTING 12X1/4 (SUCTIONS) ×2 IMPLANT
YANKAUER SUCT BULB TIP NO VENT (SUCTIONS) ×3 IMPLANT

## 2014-08-01 NOTE — Discharge Instructions (Addendum)
Orchiectomy, Care After Refer to this sheet in the next few weeks. These instructions provide you with information on caring for yourself after your procedure. Your health care provider may also give you more specific instructions. Your treatment has been planned according to current medical practices, but problems sometimes occur. Call your health care provider if you have any problems or questions after your procedure. WHAT TO EXPECT AFTER THE PROCEDURE A sterile dressing will be applied to the incision site. You may have a scrotal support. This elevates the scrotum, thereby relieving pressure on the surgical site. In those cases where the scrotal support irritates the incision site, you may be better with the support removed. It is okay if the dressing comes off, especially at night. Air will help a scab to form, which will eliminate the need for dressings during the day. HOME CARE INSTRUCTIONS  Your sterile dressing may be changed once per day or as instructed by your health care provider. If the dressing sticks to your incision site, you may use warm, soapy water or hydrogen peroxide to dampen the bandage. This will loosen the bandage from your skin so that it may be removed.  You may take showers the day after your procedure. Let the water pass gently over the surgery site. Do not rub the site. Pat the area gently or allow to air dry.  Avoid activities that may cause your incision to open.  Do not engage in sexual activity until the area is healed. Usually this will be in about 10-14 days.  Only take over-the-counter or prescription medicines for pain, discomfort, or fever as directed by your health care provider. SEEK MEDICAL CARE IF:  You experience increasing pain. SEEK IMMEDIATE MEDICAL CARE IF:  You have persistent dizziness or feel sick to your stomach (nausea).  You have difficulty staying awake or are unable to wake from sleeping.  You have difficulty breathing or a congested  cough.  You have a fever or shaking chills.  You have increasing pain or tenderness at the incision site.  You notice pus coming from the incision.  You notice a bad smell coming from the incision or dressing.  You cannot eat or drink or you develop nausea or vomiting.  You have constipation that is not helped by adjusting your diet or increasing fluid intake. Pain medicines are a common cause of constipation.  Your incision breaks open after the sutures have been removed.  You experience pain, swelling, or redness in your genital or groin area. Document Released: 01/12/2013 Document Revised: 05/17/2013 Document Reviewed: 01/12/2013 [facility] Patient Information 2015 [place], [place]. This information is not intended to replace advice given to you by your health care provider. Make sure you discuss any questions you have with your health care provider.   Post Anesthesia Home Care Instructions  Activity: Get plenty of rest for the remainder of the day. A responsible adult should stay with you for 24 hours following the procedure.  For the next 24 hours, DO NOT: -Drive a car -Paediatric nurse -Drink alcoholic beverages -Take any medication unless instructed by your physician -Make any legal decisions or sign important papers.  Meals: Start with liquid foods such as gelatin or soup. Progress to regular foods as tolerated. Avoid greasy, spicy, heavy foods. If nausea and/or vomiting occur, drink only clear liquids until the nausea and/or vomiting subsides. Call your physician if vomiting continues.  Special Instructions/Symptoms: Your throat may feel dry or sore from the anesthesia or the breathing tube placed  in your throat during surgery. If this causes discomfort, gargle with warm salt water. The discomfort should disappear within 24 hours.

## 2014-08-01 NOTE — Transfer of Care (Signed)
Immediate Anesthesia Transfer of Care Note  Patient: [name]  Procedure(s) Performed: Procedure(s): RIGHT RADICAL ORCHIECTOMY WITH PROSTESISPLACEMENT (Right)  Patient Location: PACU  Anesthesia Type:General  Level of Consciousness: sedated and responds to stimulation  Airway & Oxygen Therapy: Patient Spontanous Breathing and Patient connected to nasal cannula oxygen  Post-op Assessment: Report given to RN  Post vital signs: Reviewed and stable  Last Vitals:  Filed Vitals:   08/01/14 0724  BP: 127/77  Pulse: 78  Temp: 37 C  Resp: 16    Complications: No apparent anesthesia complications

## 2014-08-01 NOTE — Anesthesia Procedure Notes (Signed)
Procedure Name: LMA Insertion Date/Time: 08/01/2014 8:32 AM Performed by: Bethena Roys T Pre-anesthesia Checklist: Patient identified, Emergency Drugs available, Suction available and Patient being monitored Patient Re-evaluated:Patient Re-evaluated prior to inductionOxygen Delivery Method: Circle System Utilized Preoxygenation: Pre-oxygenation with 100% oxygen Intubation Type: IV induction Ventilation: Mask ventilation without difficulty LMA: LMA inserted LMA Size: 5.0 Number of attempts: 1 Airway Equipment and Method: Bite block Placement Confirmation: positive ETCO2 Tube secured with: Tape Dental Injury: Teeth and Oropharynx as per pre-operative assessment

## 2014-08-01 NOTE — Anesthesia Preprocedure Evaluation (Addendum)
Anesthesia Evaluation  Patient identified by MRN, date of birth, ID band Patient awake    Reviewed: Allergy & Precautions, H&P , NPO status , Patient's Chart, lab work & pertinent test results  Airway Mallampati: III  TM Distance: >3 FB Neck ROM: full    Dental no notable dental hx. (+) Teeth Intact, Dental Advisory Given   Pulmonary sleep apnea , Current Smoker,  breath sounds clear to auscultation  Pulmonary exam normal       Cardiovascular Exercise Tolerance: Good negative cardio ROS  Rhythm:regular Rate:Normal     Neuro/Psych negative neurological ROS  negative psych ROS   GI/Hepatic negative GI ROS, Neg liver ROS,   Endo/Other  negative endocrine ROS  Renal/GU negative Renal ROS  negative genitourinary   Musculoskeletal   Abdominal   Peds  Hematology negative hematology ROS (+)   Anesthesia Other Findings   Reproductive/Obstetrics negative OB ROS                           Anesthesia Physical Anesthesia Plan  ASA: III  Anesthesia Plan: General   Post-op Pain Management:    Induction: Intravenous  Airway Management Planned: LMA  Additional Equipment:   Intra-op Plan:   Post-operative Plan:   Informed Consent: I have reviewed the patients History and Physical, chart, labs and discussed the procedure including the risks, benefits and alternatives for the proposed anesthesia with the patient or authorized representative who has indicated his/her understanding and acceptance.   Dental Advisory Given  Plan Discussed with: CRNA and Surgeon  Anesthesia Plan Comments:         Anesthesia Quick Evaluation

## 2014-08-01 NOTE — Brief Op Note (Signed)
08/01/2014  9:31 AM  PATIENT:  [name]  49 y.o. male  PRE-OPERATIVE DIAGNOSIS:  RIGHT TESTICULAR MASS  POST-OPERATIVE DIAGNOSIS:  RIGHT TESTICULAR MASS  PROCEDURE:  Procedure(s): RIGHT RADICAL ORCHIECTOMY WITH PROSTESISPLACEMENT (Right)  SURGEON:  Surgeon(s) and Role:    * Irine Seal, MD - Primary  PHYSICIAN ASSISTANT:   ASSISTANTS: none   ANESTHESIA:   General    EBL:  Total I/O In: 200 [I.V.:200] Out: -   BLOOD ADMINISTERED:none  DRAINS: none   LOCAL MEDICATIONS USED:  MARCAINE     SPECIMEN:  Source of Specimen:  right testicle and cord  DISPOSITION OF SPECIMEN:  PATHOLOGY  COUNTS:  YES  TOURNIQUET:  * No tourniquets in log *  DICTATION: .Other Dictation: Dictation Number [id number]  PLAN OF CARE: Discharge to home after PACU  PATIENT DISPOSITION:  PACU - hemodynamically stable.   Delay start of Pharmacological VTE agent (>24hrs) due to surgical blood loss or risk of bleeding: not applicable

## 2014-08-01 NOTE — Anesthesia Postprocedure Evaluation (Signed)
  Anesthesia Post-op Note  Patient: [name]  Procedure(s) Performed: Procedure(s) (LRB): RIGHT RADICAL ORCHIECTOMY WITH PROSTESISPLACEMENT (Right)  Patient Location: PACU  Anesthesia Type: General  Level of Consciousness: awake and alert   Airway and Oxygen Therapy: Patient Spontanous Breathing  Post-op Pain: mild  Post-op Assessment: Post-op Vital signs reviewed, Patient's Cardiovascular Status Stable, Respiratory Function Stable, Patent Airway and No signs of Nausea or vomiting  Last Vitals:  Filed Vitals:   08/01/14 1130  BP: 129/69  Pulse: 63  Temp: 36.7 C  Resp: 14    Post-op Vital Signs: stable   Complications: No apparent anesthesia complications

## 2014-08-01 NOTE — H&P (Signed)
Active Problems Problems  1. Benign prostatic hyperplasia with urinary obstruction (N40.1) 2. 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 Hypogonadism, testicular (E29.1) 4. Testicular mass (N50.9)  History of Present Illness [name] returns today in f/u for his history of hypogonadism and ED. He presents today with a concern about a change in testicular size with the left being smaller and it has a dull ache. He is not sure how long this has been going on. He can have pain with erections in that area as well. He has persistent postvoid dribbling as well. He has a history hypogonadism and was on Clomid 50mg  qod but his testosterone remained normal after cessation of therapy.  He uses Cialis 2.5mg  daily and his function is good on that. He remains on Propecia.  He reports a good libido and has a new girlfriend.  He has no associated signs or symptoms.   Past Medical History Problems  1. History of Arthritis 2. History of esophageal reflux (Z87.19) 3. History of hypertension (Z86.79) 4. History of sleep apnea (Z87.09) 5. History of Murmur (R01.1)  Surgical History Problems  1. History of Cervical Vertebral Fusion 2. History of Shoulder Surgery  Current Meds 1. ALPRAZolam 1 MG Oral Tablet;  Therapy: (Recorded:22Feb2016) to Recorded 2. Brintellix 5 MG Oral Tablet;  Therapy: (Recorded:02Feb2015) to Recorded 3. Cialis 2.5 MG Oral Tablet; Take 1 tablet daily;  Therapy: [id number] to (Evaluate:28Jan2016)  Requested for: [id number]; Last  Rx:02Feb2015 Ordered 4. Multi-Vitamin TABS;  Therapy: (Recorded:08Jul2011) to Recorded 5. Propecia TABS;  Therapy: (Recorded:08Jul2011) to Recorded  Allergies Medication  1. Penicillins  Family History Problems  1. Family history of Family Health Status - Father's Age   age 26 2. Family history of Family Health Status - Mother's Age   age 45 3. Family history of Family Health Status Number Of Children   1 son 1  daughter  Social History Problems  1. Alcohol Use   10  per wk 2. Caffeine Use   2 per day 3. Divorced 4. Former smoker [phone]) 5. Occupation:   [employer]. 6. Tobacco Use   smoked for 10yrs quit 61yr ago  Past and social history reviewed and updated.   Review of Systems  Genitourinary: post-void dribbling and erectile dysfunction, but preserved libido.  Constitutional: not feeling tired (fatigue) and no recent weight loss.    Vitals Vital Signs [Data Includes: Last 1 Day]  Recorded: 22Feb2016 03:02PM  Blood Pressure: 148 / 82 Temperature: 97.6 F Heart Rate: 87  Physical Exam Constitutional: Well nourished and well developed . No acute distress.  Pulmonary: No respiratory distress and normal respiratory rhythm and effort.  Cardiovascular: Heart rate and rhythm are normal . No peripheral edema.  Abdomen: The abdomen is soft and nontender.  No inguinal hernia is present on the right.  No inguinal hernia is present on the left.  Rectal: Rectal exam demonstrates normal sphincter tone. Estimated prostate size is 1+. Normal rectal tone, no rectal masses, prostate is smooth, symmetric and non-tender. The prostate has no nodularity and is not indurated. The left seminal vesicle is nonpalpable. The right seminal vesicle is nonpalpable. The perineum is normal on inspection.  Genitourinary: Examination of the penis demonstrates a normal meatus. The penis is circumcised. The scrotum is normal in appearance. The right epididymis is palpably normal. The left epididymis is palpably normal. The right testis is found to have a 4 cm right testicular mass, but not atrophic. The left testis is tender and atrophic, but normal.  Lymphatics: The femoral and inguinal nodes are not enlarged or tender.    Results/Data  Urine [Data Includes: Last 1 Day]   [id number]  COLOR YELLOW   APPEARANCE CLEAR   SPECIFIC GRAVITY 1.020   pH 6.0   GLUCOSE 100 mg/dL  BILIRUBIN NEG   KETONE NEG mg/dL   BLOOD TRACE   PROTEIN NEG mg/dL  UROBILINOGEN 0.2 mg/dL  NITRITE NEG   LEUKOCYTE ESTERASE NEG   SQUAMOUS EPITHELIAL/HPF NONE SEEN   WBC 0-2 WBC/hpf  RBC 0-2 RBC/hpf  BACTERIA NONE SEEN   CRYSTALS NONE SEEN   CASTS NONE SEEN   Other AMORPHOUS NOTED    The following images/tracing/specimen were independently visualized:  Scrotal US with doppler today shows a 4.12cm x 4.39cm x 3.30 mass in the right testes with a rim of more normal testicular tissue. The left testicle is atrophied with dimensions of 3.98cm x 1.19cm x 3.25cm with normal parenchyma. There is a small cyst in the left epididymal head and the right is unremarkable. There are minimal hydroceles and a small right varicocele. Blood flow is present but abnormal in the right testicular mass.  The following clinical lab reports were reviewed:  UA reviewed.       Assessment Assessed  1. Testicular mass (N50.9) 2. Benign prostatic hyperplasia with urinary obstruction (N40.1) 3. Hypogonadism, testicular (E29.1) 4. 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   He has a history of hypogonadism that previously resolved with Clomid.  He has ED that is managed with Cialis for daily use.  He remains on propecia.   Plan Benign prostatic hyperplasia with urinary obstruction  1. PSA REFLEX TO FREE; Status:In Progress - Specimen/Data Collected;   Done:  [id number] Benign prostatic hyperplasia with urinary obstruction, Hypogonadism, testicular, Testicular mass  2. VENIPUNCTURE; Status:Complete;   Done: [id number] Hypogonadism, testicular  3. TESTOSTERONE; Status:In Progress - Specimen/Data Collected;   Done: 90WIO9735 Testicular mass  4. Follow-up Schedule Surgery Office  Follow-up  Status: Complete  Done: 22Feb2016 5. ALPHA-FETOPROTIEN (TUMOR MARKER); Status:In Progress - Specimen/Data  Collected;   Done:  32DJM4268 6. BETA HCG TUMOR MARKER; Status:In Progress - Specimen/Data Collected;   Done:  34HDQ2229 7. LDH; Status:In Progress - Specimen/Data Collected;   Done: 79GXQ1194 8. SCROTAL U/S; Status:Resulted - Requires Verification;   Done: 17EYC1448 12:00AM  I will get a repeat testosterone and PSA today along with tumor markers.  He is going to need to have a right radical orchiectomy and I have reviewed the risks including bleeding, infection, wound complications, nerve injury, thrombotic events and anesthetic risks.  He is interested in having a testicular prosthesis placed and I discussed that with him as well.   Discussion/Summary CC: [facility] [employer]

## 2014-08-02 ENCOUNTER — Encounter (HOSPITAL_BASED_OUTPATIENT_CLINIC_OR_DEPARTMENT_OTHER): Payer: Self-pay | Admitting: Urology

## 2014-08-02 NOTE — Op Note (Signed)
[name], [name]                 ACCOUNT NO.:  [id number]  MEDICAL RECORD NO.:  [id number]  LOCATION:                                 FACILITY:  PHYSICIAN:  Marshall Cork. Jeffie Pollock, M.D.    DATE OF BIRTH:  12-Feb-1966  DATE OF PROCEDURE:  08/01/2014 DATE OF DISCHARGE:  08/01/2014                              OPERATIVE REPORT   PROCEDURE:  Right radical orchiectomy with placement of right testicular prosthesis.  PREOPERATIVE DIAGNOSIS:  Right testicular cancer.  POSTOPERATIVE DIAGNOSIS:  Right testicular cancer.  SURGEON:  Marshall Cork. Jeffie Pollock, M.D.  ANESTHESIA:  General.  SPECIMEN:  Right testicle and cord.  DRAINS:  None.  COMPLICATIONS:  None.  BLOOD LOSS:  Minimal.  INDICATIONS:  [name] is a 49 year old white male, who presented with a right testicular mass and ultrasound was suspicious for a testicular cancer.  His preoperative tumor markers demonstrated a beta-HCG of 4000. CT scan and chest x-ray were unremarkable.  It was felt that right radical orchiectomy was indicated.  He elected placement of a testicular prosthesis.  FINDINGS AND PROCEDURE:  He was given 2 g of Ancef and 160 mg of gentamicin.  He was taken to the operating room where general anesthetic was induced.  He was placed in the supine position.  His right inguinal area was clipped.  He was then prepped with Betadine solution and draped in usual sterile fashion.  An oblique incision was made over the skin lines 2 cm superolateral to the pubic tubercle on the right.  The incision was then carried down through the subcutaneous tissues with the Bovie, exposing the external oblique fascia.  The fascia was then incised along its fibers to expose the cord.  The ilioinguinal nerve was identified and was protected. Once the external oblique fascia was opened down to the external ring, the testicle was delivered into the inguinal incision with gentle pressure.  The cord was then dissected out from the floor of the inguinal  canal and doubly looped with a 0.25-inch Penrose drain to occlude the cord.  The testicle was then gently delivered into the inguinal canal and the gubernacular attachments were taken down with the Bovie with great care being taken to maintain hemostasis and protect the ilioinguinal nerve.  Once the testicle was freed from the scrotum, the cord was divided into two packets and the specimen was removed.  The cord was then doubly ligated using 0 Vicryl ties.  Once the end of the cord was dropped back into the retroperitoneum, inspection revealed no significant bleeding.  A large testicular prosthesis was prepared.  The TROSA prosthesis, catalog [phone], batch code [id number], size 2.9 x 4.5 cm, 20 mL was prepared by soaking with antibiotic solution and filling with 20 mL of sterile injectable saline.  The prosthesis was then brought onto the field where the wound was copiously irrigated with antibiotic solution and a 2-0 PDS suture was then used to secure the testicle to the dependent portion of the right scrotum.  Care was taken not to penetrate the skin.  Once the testicle was secured, it was returned to the lab to drop back down in the normal position and  appeared to be adequately located.  At this point, final inspection for hemostasis was performed, a small vein was fulgurated.  The external oblique fascia was closed using running 3-0 Vicryl suture, once again with great care being taken to avoid entrapment of the ilioinguinal nerve.  Once the closure was complete, the fascia in surrounding area was infiltrated with 0.25% Marcaine.  Additional infiltration of subcutaneous tissues was performed for total of 14 mL of Marcaine.  At this point, the subcutaneous tissues were reapproximated using interrupted 3-0 chromic sutures.  The skin was closed with 4-0 Monocryl and Dermabond.  Dressing of fluff, Kerlix, and scrotal support was then applied.  His anesthetic was reversed.  He was  moved to the recovery room in stable condition.  There were no complications.     Marshall Cork. Jeffie Pollock, M.D.     JJW/MEDQ  D:  08/01/2014  T:  08/02/2014  Job:  [id number]

## 2014-08-10 ENCOUNTER — Telehealth: Payer: Self-pay | Admitting: Oncology

## 2014-08-10 NOTE — Telephone Encounter (Signed)
S/W PT IN REF TO NP APPT. ON 09/15/14@1 :30 REFERRING DR WRENN DX-PROSTATE CA

## 2014-08-15 ENCOUNTER — Telehealth: Payer: Self-pay | Admitting: Oncology

## 2014-08-15 NOTE — Telephone Encounter (Signed)
Chart delivered 08/15/14

## 2014-09-15 ENCOUNTER — Other Ambulatory Visit: Payer: BLUE CROSS/BLUE SHIELD

## 2014-09-15 ENCOUNTER — Telehealth: Payer: Self-pay | Admitting: Oncology

## 2014-09-15 ENCOUNTER — Ambulatory Visit (HOSPITAL_BASED_OUTPATIENT_CLINIC_OR_DEPARTMENT_OTHER): Payer: BLUE CROSS/BLUE SHIELD | Admitting: Oncology

## 2014-09-15 ENCOUNTER — Ambulatory Visit: Payer: BLUE CROSS/BLUE SHIELD

## 2014-09-15 ENCOUNTER — Encounter: Payer: Self-pay | Admitting: Oncology

## 2014-09-15 VITALS — BP 127/67 | HR 75 | Temp 98.6°F | Resp 18 | Ht 68.0 in | Wt 170.2 lb

## 2014-09-15 DIAGNOSIS — C6290 Malignant neoplasm of unspecified testis, unspecified whether descended or undescended: Secondary | ICD-10-CM | POA: Insufficient documentation

## 2014-09-15 DIAGNOSIS — C6291 Malignant neoplasm of right testis, unspecified whether descended or undescended: Secondary | ICD-10-CM

## 2014-09-15 MED ORDER — ONDANSETRON HCL 8 MG PO TABS: 8.0000 mg | ORAL_TABLET | Freq: Three times a day (TID) | ORAL | Status: DC | PRN

## 2014-09-15 NOTE — Progress Notes (Signed)
Checked in new pt with no financial concerns prior to seeing the dr.  Pt has my card for any billing questions or concerns. ° °

## 2014-09-15 NOTE — Telephone Encounter (Signed)
Gave avs & calendar for April/May. Sent message to schedule treatment. °

## 2014-09-15 NOTE — Progress Notes (Signed)
Please see consult note.  

## 2014-09-15 NOTE — Consult Note (Signed)
Reason for Referral: Testicular cancer.   HPI: 49 year old gentleman native of [place] currently lives and [place]. He is a rather healthy gentleman with history of sleep apnea as well as history of hypogonadism.Marland Kitchen He was evaluated by Dr. Jeffie Pollock for right testicular enlargement. He was initially thought that his left testicle was smaller but for the most part no specific pain. He had an ultrasound done showed a 4.1 x 4.3 x 3.3 cm mass in the right testicle and his left testicle had atrophied. CT scan of the abdomen and pelvis as well as chest x-ray in the early part of March did not show evidence of any metastatic disease. 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 Lymphovascular invasion was identified in the pathological staging was T2 NX. He recovered well at this time without any delayed complications. It here to discuss adjuvant therapy options. Clinically, he is asymptomatic. He does not report any headaches, blurry vision, syncope or seizures. He does not report any fevers, chills, sweats. He does not report any chest pain, palpitation or orthopnea. He does not report any cough or hemoptysis or hematemesis. He is not report any nausea, vomiting, abdominal pain, or metastatic edema, constipation or diarrhea. He does not report any frequency, urgency or hesitancy. He does report erectile dysfunction. He does not report any arthralgias or myalgias. He does not report any anxiety or depression. Remaining review of systems unremarkable.   Past Medical History  Diagnosis Date  . Sleep apnea     no cpap, unable to tolerate mask  . Heart murmur   . Headache     tension h/a's  . Testicular mass     rt sided  . Weakness     arms and legs  . Wears glasses     for computer work  :  Past Surgical History  Procedure Laterality Date  . Cervical spine surgery      w/ plates and  screws  . Shoulder arthroscopy w/ acromial repair Bilateral   . Orchiectomy Right 08/01/2014    Procedure: RIGHT RADICAL ORCHIECTOMY WITH PROSTESISPLACEMENT;  Surgeon: Irine Seal, MD;  Location: [facility];  Service: Urology;  Laterality: Right;  :   Current outpatient prescriptions:  .  ALPRAZolam (XANAX) 1 MG tablet, Take 1 mg by mouth at bedtime., Disp: , Rfl:  .  finasteride (PROPECIA) 1 MG tablet, Take 1 mg by mouth daily., Disp: , Rfl:  .  HYDROcodone-acetaminophen (NORCO) 5-325 MG per tablet, Take 1 tablet by mouth every 6 (six) hours as needed for moderate pain., Disp: 30 tablet, Rfl: 0 .  traZODone (DESYREL) 50 MG tablet, Take 50 mg by mouth at bedtime., Disp: , Rfl:  .  Vortioxetine HBr (BRINTELLIX) 20 MG TABS, Take 20 mg by mouth daily., Disp: , Rfl:  .  ondansetron (ZOFRAN) 8 MG tablet, Take 1 tablet (8 mg total) by mouth every 8 (eight) hours as needed for nausea or vomiting., Disp: 20 tablet, Rfl: 0:  Allergies  Allergen Reactions  . Penicillins Hives  :  No family history on file.:  History   Social History  . Marital Status: Divorced    Spouse Name: N/A  . Number of Children: N/A  . Years of Education: N/A   Occupational History  . Not on file.   Social History Main Topics  . Smoking status: Current Some Day Smoker  .  Smokeless tobacco: Not on file  . Alcohol Use: 7.2 oz/week    12 Cans of beer per week  . Drug Use: Not on file  . Sexual Activity: Not on file   Other Topics Concern  . Not on file   Social History Narrative  :  Pertinent items are noted in HPI.  Exam: ECOG 0 Blood pressure 127/67, pulse 75, temperature 98.6 F (37 C), temperature source Oral, resp. rate 18, height 5\' 8"  (1.727 m), weight 170 lb 3.2 oz (77.202 kg), SpO2 99 %. General appearance: alert and cooperative Head: Normocephalic, without obvious abnormality Throat: lips, mucosa, and tongue normal; teeth and gums normal Neck: no adenopathy Back:  negative Resp: clear to auscultation bilaterally Chest wall: no tenderness Cardio: regular rate and rhythm, S1, S2 normal, no murmur, click, rub or gallop GI: soft, non-tender; bowel sounds normal; no masses,  no organomegaly Extremities: extremities normal, atraumatic, no cyanosis or edema Pulses: 2+ and symmetric Skin: Skin color, texture, turgor normal. No rashes or lesions Lymph nodes: Cervical, supraclavicular, and axillary nodes normal. Neurologic: Grossly normal    Assessment and Plan:    49 year old gentleman with the following issues:  1. 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 He is status post right orchiectomy done on 08/01/2014. The pathology showed a T2 NX. Seminoma measuring 6 cm with lymphovascular invasion. His tumor markers are normal per his report although they are not available to me at this moment.  The role of adjuvant therapy and early stage seminoma was discussed today in detail. Options would include adjuvant radiation therapy versus adjuvant single agent carboplatin chemotherapy versus observation and surveillance. The risks and benefits of all these modalities were discussed. I explained to him that to him without any therapy his risk of relapse is approximated around 20% and was therapy that risk would be reduced potentially by at least 10%. Observation and surveillance is the recommended approach as radiation therapy and chemotherapy can potentially overtreat 80% of that time.  He is interested in active treatment at this time and would like to consider chemotherapy more seriously. I discussed with him the specific complication associated with single agent carboplatin administered an AUC of 7 for 1-2 cycles. Complications include nausea, myelosuppression, neutropenia, neutropenic sepsis, bleeding, renal dysfunction and potentially delayed complications such as secondary malignancy among  others.  After discussing the risks and benefits as well as approaches he elected to proceed with adjuvant chemotherapy. I will schedule him tentatively to have 2 cycles but certainly we will discontinue the second cycle 3 has any problems with the first one. Chemotherapy education class will be scheduled as well.  2. IV access: Peripheral veins will be adequate at this time.  3. Antiemetics: Prescription for Zofran was given to the patient in case he develops nausea.

## 2014-09-18 ENCOUNTER — Telehealth: Payer: Self-pay | Admitting: Oncology

## 2014-09-18 NOTE — Telephone Encounter (Signed)
Confirm appointment change for 04/29.

## 2014-09-19 ENCOUNTER — Encounter: Payer: Self-pay | Admitting: *Deleted

## 2014-09-19 ENCOUNTER — Other Ambulatory Visit: Payer: BLUE CROSS/BLUE SHIELD

## 2014-09-22 ENCOUNTER — Ambulatory Visit (HOSPITAL_BASED_OUTPATIENT_CLINIC_OR_DEPARTMENT_OTHER): Payer: BLUE CROSS/BLUE SHIELD

## 2014-09-22 ENCOUNTER — Other Ambulatory Visit: Payer: Self-pay | Admitting: Oncology

## 2014-09-22 ENCOUNTER — Other Ambulatory Visit (HOSPITAL_BASED_OUTPATIENT_CLINIC_OR_DEPARTMENT_OTHER): Payer: BLUE CROSS/BLUE SHIELD

## 2014-09-22 ENCOUNTER — Other Ambulatory Visit: Payer: Self-pay | Admitting: *Deleted

## 2014-09-22 VITALS — BP 129/64 | HR 71 | Temp 98.7°F | Resp 20

## 2014-09-22 DIAGNOSIS — C6291 Malignant neoplasm of right testis, unspecified whether descended or undescended: Secondary | ICD-10-CM | POA: Diagnosis not present

## 2014-09-22 DIAGNOSIS — C6290 Malignant neoplasm of unspecified testis, unspecified whether descended or undescended: Secondary | ICD-10-CM

## 2014-09-22 DIAGNOSIS — Z5111 Encounter for antineoplastic chemotherapy: Secondary | ICD-10-CM

## 2014-09-22 LAB — CBC WITH DIFFERENTIAL/PLATELET
BASO%: 0.4 % (ref 0.0–2.0)
BASOS ABS: 0 10*3/uL (ref 0.0–0.1)
EOS%: 2.7 % (ref 0.0–7.0)
Eosinophils Absolute: 0.2 10*3/uL (ref 0.0–0.5)
HEMATOCRIT: 49.2 % (ref 38.4–49.9)
HEMOGLOBIN: 16.9 g/dL (ref 13.0–17.1)
LYMPH%: 32.6 % (ref 14.0–49.0)
MCH: 30.5 pg (ref 27.2–33.4)
MCHC: 34.3 g/dL (ref 32.0–36.0)
MCV: 88.6 fL (ref 79.3–98.0)
MONO#: 0.5 10*3/uL (ref 0.1–0.9)
MONO%: 7.1 % (ref 0.0–14.0)
NEUT%: 57.2 % (ref 39.0–75.0)
NEUTROS ABS: 3.9 10*3/uL (ref 1.5–6.5)
PLATELETS: 134 10*3/uL — AB (ref 140–400)
RBC: 5.55 10*6/uL (ref 4.20–5.82)
RDW: 12.8 % (ref 11.0–14.6)
WBC: 6.7 10*3/uL (ref 4.0–10.3)
lymph#: 2.2 10*3/uL (ref 0.9–3.3)

## 2014-09-22 LAB — LACTATE DEHYDROGENASE (CC13): LDH: 148 U/L (ref 125–245)

## 2014-09-22 LAB — COMPREHENSIVE METABOLIC PANEL (CC13)
ALT: 21 U/L (ref 0–55)
AST: 17 U/L (ref 5–34)
Albumin: 4.4 g/dL (ref 3.5–5.0)
Alkaline Phosphatase: 73 U/L (ref 40–150)
Anion Gap: 12 mEq/L — ABNORMAL HIGH (ref 3–11)
BILIRUBIN TOTAL: 0.61 mg/dL (ref 0.20–1.20)
BUN: 10.7 mg/dL (ref 7.0–26.0)
CALCIUM: 9.9 mg/dL (ref 8.4–10.4)
CO2: 26 mEq/L (ref 22–29)
Chloride: 105 mEq/L (ref 98–109)
Creatinine: 1.1 mg/dL (ref 0.7–1.3)
EGFR: 79 mL/min/{1.73_m2} — AB (ref >90)
Glucose: 133 mg/dl (ref 70–140)
Potassium: 3.8 mEq/L (ref 3.5–5.1)
SODIUM: 143 meq/L (ref 136–145)
Total Protein: 7.8 g/dL (ref 6.4–8.3)

## 2014-09-22 MED ORDER — SODIUM CHLORIDE 0.9 % IV SOLN
Freq: Once | INTRAVENOUS | Status: AC
  Administered 2014-09-22: 13:00:00 via INTRAVENOUS

## 2014-09-22 MED ORDER — SODIUM CHLORIDE 0.9 % IV SOLN
795.9000 mg | Freq: Once | INTRAVENOUS | Status: AC
  Administered 2014-09-22: 800 mg via INTRAVENOUS
  Filled 2014-09-22: qty 80

## 2014-09-22 MED ORDER — DEXAMETHASONE SODIUM PHOSPHATE 100 MG/10ML IJ SOLN
Freq: Once | INTRAMUSCULAR | Status: AC
Start: 2014-09-22 — End: 2014-09-22
  Administered 2014-09-22: 13:00:00 via INTRAVENOUS
  Filled 2014-09-22: qty 8

## 2014-09-22 NOTE — Patient Instructions (Signed)
[facility] Discharge Instructions for Patients Receiving Chemotherapy  Today you received the following chemotherapy agents:  Carboplatin  To help prevent nausea and vomiting after your treatment, we encourage you to take your nausea medication as ordered per MD.   If you develop nausea and vomiting that is not controlled by your nausea medication, call the clinic.   BELOW ARE SYMPTOMS THAT SHOULD BE REPORTED IMMEDIATELY:  *FEVER GREATER THAN 100.5 F  *CHILLS WITH OR WITHOUT FEVER  NAUSEA AND VOMITING THAT IS NOT CONTROLLED WITH YOUR NAUSEA MEDICATION  *UNUSUAL SHORTNESS OF BREATH  *UNUSUAL BRUISING OR BLEEDING  TENDERNESS IN MOUTH AND THROAT WITH OR WITHOUT PRESENCE OF ULCERS  *URINARY PROBLEMS  *BOWEL PROBLEMS  UNUSUAL RASH Items with * indicate a potential emergency and should be followed up as soon as possible.  Feel free to call the clinic you have any questions or concerns. The clinic phone number is [phone].  Please show the [place] at check-in to the Emergency Department and triage nurse.

## 2014-09-25 ENCOUNTER — Other Ambulatory Visit: Payer: Self-pay | Admitting: Oncology

## 2014-09-25 ENCOUNTER — Telehealth: Payer: Self-pay | Admitting: Oncology

## 2014-09-25 ENCOUNTER — Telehealth: Payer: Self-pay | Admitting: *Deleted

## 2014-09-25 NOTE — Telephone Encounter (Signed)
Left message for patient- chemo follow up call. Also informed him he is to have a lab appt on 5/6. Please call us back if he has not received a time for this appt. Also to call us if having any problems post chemo.

## 2014-09-25 NOTE — Telephone Encounter (Signed)
Lft msg for pt confirming labs for 05/06 per 04/29 POF.... KJ mailed scheduled

## 2014-09-25 NOTE — Telephone Encounter (Signed)
-----   Message from San Morelle, RN sent at 09/22/2014  3:18 PM EDT ----- Regarding: [facility] f/u call Contact: [phone] First time Carboplatin.  Dr. Alen Blew.  Pt tolerated with no difficulties or complaints.  Please be sure that lab appt is made for Friday 09/29/14

## 2014-09-26 LAB — BETA HCG QUANT (REF LAB): Beta hCG, Tumor Marker: <2 m[IU]/mL (ref <5.0)

## 2014-09-26 LAB — AFP TUMOR MARKER: AFP-Tumor Marker: 3.5 ng/mL (ref <6.1)

## 2014-09-29 ENCOUNTER — Other Ambulatory Visit (HOSPITAL_BASED_OUTPATIENT_CLINIC_OR_DEPARTMENT_OTHER): Payer: BLUE CROSS/BLUE SHIELD

## 2014-09-29 DIAGNOSIS — C6291 Malignant neoplasm of right testis, unspecified whether descended or undescended: Secondary | ICD-10-CM

## 2014-09-29 DIAGNOSIS — C6290 Malignant neoplasm of unspecified testis, unspecified whether descended or undescended: Secondary | ICD-10-CM

## 2014-09-29 LAB — CBC WITH DIFFERENTIAL/PLATELET
BASO%: 1.2 % (ref 0.0–2.0)
Basophils Absolute: 0.1 10*3/uL (ref 0.0–0.1)
EOS%: 3.4 % (ref 0.0–7.0)
Eosinophils Absolute: 0.2 10*3/uL (ref 0.0–0.5)
HCT: 47.9 % (ref 38.4–49.9)
HGB: 16 g/dL (ref 13.0–17.1)
LYMPH%: 26.9 % (ref 14.0–49.0)
MCH: 29.5 pg (ref 27.2–33.4)
MCHC: 33.4 g/dL (ref 32.0–36.0)
MCV: 88.4 fL (ref 79.3–98.0)
MONO#: 0.7 10*3/uL (ref 0.1–0.9)
MONO%: 10.9 % (ref 0.0–14.0)
NEUT#: 3.7 10*3/uL (ref 1.5–6.5)
NEUT%: 57.6 % (ref 39.0–75.0)
Platelets: 135 10*3/uL — ABNORMAL LOW (ref 140–400)
RBC: 5.42 10*6/uL (ref 4.20–5.82)
RDW: 12.7 % (ref 11.0–14.6)
WBC: 6.4 10*3/uL (ref 4.0–10.3)
lymph#: 1.7 10*3/uL (ref 0.9–3.3)

## 2014-10-12 ENCOUNTER — Other Ambulatory Visit: Payer: Self-pay | Admitting: *Deleted

## 2014-10-13 ENCOUNTER — Ambulatory Visit (HOSPITAL_BASED_OUTPATIENT_CLINIC_OR_DEPARTMENT_OTHER): Payer: BLUE CROSS/BLUE SHIELD

## 2014-10-13 ENCOUNTER — Ambulatory Visit (HOSPITAL_BASED_OUTPATIENT_CLINIC_OR_DEPARTMENT_OTHER): Payer: BLUE CROSS/BLUE SHIELD | Admitting: Oncology

## 2014-10-13 ENCOUNTER — Telehealth: Payer: Self-pay | Admitting: Oncology

## 2014-10-13 ENCOUNTER — Other Ambulatory Visit (HOSPITAL_BASED_OUTPATIENT_CLINIC_OR_DEPARTMENT_OTHER): Payer: BLUE CROSS/BLUE SHIELD

## 2014-10-13 VITALS — BP 105/63 | HR 64 | Temp 98.3°F | Resp 20 | Ht 68.0 in | Wt 169.0 lb

## 2014-10-13 DIAGNOSIS — C6291 Malignant neoplasm of right testis, unspecified whether descended or undescended: Secondary | ICD-10-CM

## 2014-10-13 DIAGNOSIS — Z5111 Encounter for antineoplastic chemotherapy: Secondary | ICD-10-CM

## 2014-10-13 DIAGNOSIS — C6290 Malignant neoplasm of unspecified testis, unspecified whether descended or undescended: Secondary | ICD-10-CM

## 2014-10-13 DIAGNOSIS — D696 Thrombocytopenia, unspecified: Secondary | ICD-10-CM | POA: Diagnosis not present

## 2014-10-13 LAB — COMPREHENSIVE METABOLIC PANEL (CC13)
ALT: 20 U/L (ref 0–55)
ANION GAP: 13 meq/L — AB (ref 3–11)
AST: 19 U/L (ref 5–34)
Albumin: 3.8 g/dL (ref 3.5–5.0)
Alkaline Phosphatase: 66 U/L (ref 40–150)
BUN: 10.3 mg/dL (ref 7.0–26.0)
CALCIUM: 8.6 mg/dL (ref 8.4–10.4)
CHLORIDE: 106 meq/L (ref 98–109)
CO2: 22 meq/L (ref 22–29)
CREATININE: 0.9 mg/dL (ref 0.7–1.3)
EGFR: >90 mL/min/{1.73_m2} (ref >90)
Glucose: 173 mg/dl — ABNORMAL HIGH (ref 70–140)
Potassium: 3.7 mEq/L (ref 3.5–5.1)
Sodium: 141 mEq/L (ref 136–145)
Total Bilirubin: 0.42 mg/dL (ref 0.20–1.20)
Total Protein: 6.7 g/dL (ref 6.4–8.3)

## 2014-10-13 LAB — CBC WITH DIFFERENTIAL/PLATELET
BASO%: 0.3 % (ref 0.0–2.0)
BASOS ABS: 0 10*3/uL (ref 0.0–0.1)
EOS%: 2.7 % (ref 0.0–7.0)
Eosinophils Absolute: 0.1 10*3/uL (ref 0.0–0.5)
HEMATOCRIT: 42.8 % (ref 38.4–49.9)
HGB: 14.8 g/dL (ref 13.0–17.1)
LYMPH%: 31.6 % (ref 14.0–49.0)
MCH: 30.8 pg (ref 27.2–33.4)
MCHC: 34.6 g/dL (ref 32.0–36.0)
MCV: 89.2 fL (ref 79.3–98.0)
MONO#: 0.2 10*3/uL (ref 0.1–0.9)
MONO%: 6.1 % (ref 0.0–14.0)
NEUT%: 59.3 % (ref 39.0–75.0)
NEUTROS ABS: 2.2 10*3/uL (ref 1.5–6.5)
PLATELETS: 58 10*3/uL — AB (ref 140–400)
RBC: 4.8 10*6/uL (ref 4.20–5.82)
RDW: 14.3 % (ref 11.0–14.6)
WBC: 3.8 10*3/uL — AB (ref 4.0–10.3)
lymph#: 1.2 10*3/uL (ref 0.9–3.3)
nRBC: 0 % (ref 0–0)

## 2014-10-13 MED ORDER — DEXAMETHASONE SODIUM PHOSPHATE 100 MG/10ML IJ SOLN
Freq: Once | INTRAMUSCULAR | Status: AC
  Administered 2014-10-13: 10:00:00 via INTRAVENOUS
  Filled 2014-10-13: qty 8

## 2014-10-13 MED ORDER — SODIUM CHLORIDE 0.9 % IV SOLN
Freq: Once | INTRAVENOUS | Status: AC
  Administered 2014-10-13: 10:00:00 via INTRAVENOUS

## 2014-10-13 MED ORDER — SODIUM CHLORIDE 0.9 % IV SOLN
680.0000 mg | Freq: Once | INTRAVENOUS | Status: AC
  Administered 2014-10-13: 680 mg via INTRAVENOUS
  Filled 2014-10-13: qty 68

## 2014-10-13 NOTE — Progress Notes (Signed)
Hematology and Oncology Follow Up Visit  [name] [id number] [birth year] 49 y.o. 10/13/2014 9:19 AM No PCP Per PatientNo ref. provider found   Principle Diagnosis: 49 year old gentleman with right testicle seminoma diagnosed in March 2016. 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   Prior Therapy: He is status post right orchiectomy done on 08/01/2014.  Current therapy: Carboplatin adjuvant chemotherapy given at AUC of 7 first dose given on 09/22/2014. Total of 2 cycles are planned.  Interim History:  [name] returns today for a follow-up visit. Since the last visit, he received a first dose of adjuvant carboplatin without any major complications. He reported mild nausea but no vomiting. He had reported a grade 1 fatigue but otherwise no complications. He did not report any cough or hemoptysis. Did not report any lymphadenopathy or petechiae.  He does not report any headaches, blurry vision, syncope or seizures. He does not report any fevers, chills, sweats. He does not report any chest pain, palpitation or orthopnea. He does not report any cough or hemoptysis or hematemesis. He is not report any nausea, vomiting, abdominal pain, or metastatic edema, constipation or diarrhea. He does not report any frequency, urgency or hesitancy. He does report erectile dysfunction. He does not report any arthralgias or myalgias. He does not report any anxiety or depression. Remaining review of systems unremarkable.    Medications: I have reviewed the patient's current medications.  Current Outpatient Prescriptions  Medication Sig Dispense Refill  . ALPRAZolam (XANAX) 1 MG tablet Take 1 mg by mouth at bedtime.    . finasteride (PROPECIA) 1 MG tablet Take 1 mg by mouth daily.    Marland Kitchen HYDROcodone-acetaminophen (NORCO) 5-325 MG per tablet Take 1 tablet by mouth every 6 (six) hours as needed for moderate pain. 30 tablet 0  . ondansetron (ZOFRAN) 8 MG tablet Take 1 tablet (8 mg total) by  mouth every 8 (eight) hours as needed for nausea or vomiting. 20 tablet 0  . traZODone (DESYREL) 50 MG tablet Take 50 mg by mouth at bedtime.    . Vortioxetine HBr (BRINTELLIX) 20 MG TABS Take 20 mg by mouth daily.     No current facility-administered medications for this visit.     Allergies:  Allergies  Allergen Reactions  . Penicillins Hives    Past Medical History, Surgical history, Social history, and Family History were reviewed and updated.   Physical Exam: Blood pressure 105/63, pulse 64, temperature 98.3 F (36.8 C), temperature source Oral, resp. rate 20, height 5\' 8"  (1.727 m), weight 169 lb (76.658 kg), SpO2 100 %. ECOG: 0 General appearance: alert and cooperative Head: Normocephalic, without obvious abnormality Neck: no adenopathy Lymph nodes: Cervical, supraclavicular, and axillary nodes normal. Heart:regular rate and rhythm, S1, S2 normal, no murmur, click, rub or gallop Lung:chest clear, no wheezing, rales, normal symmetric air entry Abdomin: soft, non-tender, without masses or organomegaly EXT:no erythema, induration, or nodules   Lab Results: Lab Results  Component Value Date   WBC 3.8* 10/13/2014   HGB 14.8 10/13/2014   HCT 42.8 10/13/2014   MCV 89.2 10/13/2014   PLT 58* 10/13/2014     Chemistry      Component Value Date/Time   NA 143 09/22/2014 1105   NA 141 03/26/2011 1142   K 3.8 09/22/2014 1105   K 3.9 03/26/2011 1142   CL 102 03/26/2011 1142   CO2 26 09/22/2014 1105   CO2 25 07/26/2010 2040   BUN 10.7 09/22/2014 1105  BUN 8 03/26/2011 1142   CREATININE 1.1 09/22/2014 1105   CREATININE 1.30 03/26/2011 1142      Component Value Date/Time   CALCIUM 9.9 09/22/2014 1105   CALCIUM 9.0 07/26/2010 2040   ALKPHOS 73 09/22/2014 1105   ALKPHOS 54 07/26/2010 2040   AST 17 09/22/2014 1105   AST 29 07/26/2010 2040   ALT 21 09/22/2014 1105   ALT 31 07/26/2010 2040   BILITOT 0.61 09/22/2014 1105   BILITOT 0.5 07/26/2010 2040           Impression and Plan:    49 year old gentleman with the following issues:  1. 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 He is status post right orchiectomy done on 08/01/2014. The pathology showed a T2 NX. Seminoma measuring 6 cm with lymphovascular invasion.   He is currently on adjuvant chemotherapy in the form of carboplatin. He received the first dose of AUC of 7 and ready to proceed with the second dose. The plan is to proceed today as scheduled with a reduced dose of carboplatin to AUC of 6 given his thrombocytopenia.  He will proceed with active surveillance after that with clinical visit in 3 months and imaging studies in 6 months.  2. IV access: Peripheral veins will continue to be used.  3. Antiemetics: He is using Zofran with success.  4. Thrombocytopenia: Bleeding precautions was given to the patient today. In the dose reduction of carboplatin will help as well. I will repeat his CBC in about 10 days to ensure adequate counts at that time.  5. Follow-up: Will be in 3 months sooner if needed to.   [facility], MD 5/20/20169:19 AM

## 2014-10-13 NOTE — Telephone Encounter (Signed)
Gave adn printed appt sched and avs for pt for June and Aug

## 2014-10-13 NOTE — Patient Instructions (Signed)
[facility] Discharge Instructions for Patients Receiving Chemotherapy  Today you received the following chemotherapy agents :  Carboplatin.  To help prevent nausea and vomiting after your treatment, we encourage you to take your nausea medication as prescribed.   If you develop nausea and vomiting that is not controlled by your nausea medication, call the clinic.   BELOW ARE SYMPTOMS THAT SHOULD BE REPORTED IMMEDIATELY:  *FEVER GREATER THAN 100.5 F  *CHILLS WITH OR WITHOUT FEVER  NAUSEA AND VOMITING THAT IS NOT CONTROLLED WITH YOUR NAUSEA MEDICATION  *UNUSUAL SHORTNESS OF BREATH  *UNUSUAL BRUISING OR BLEEDING  TENDERNESS IN MOUTH AND THROAT WITH OR WITHOUT PRESENCE OF ULCERS  *URINARY PROBLEMS  *BOWEL PROBLEMS  UNUSUAL RASH Items with * indicate a potential emergency and should be followed up as soon as possible.  Feel free to call the clinic you have any questions or concerns. The clinic phone number is [phone].  Please show the CHEMO ALERT CARD at check-in to the Emergency Department and triage nurse.   

## 2014-10-24 ENCOUNTER — Telehealth: Payer: Self-pay | Admitting: Oncology

## 2014-10-24 NOTE — Telephone Encounter (Signed)
Pt called to r/s labs due to pt has work issue, pt confirmed labs D/T.... Cherylann Banas

## 2014-10-25 ENCOUNTER — Other Ambulatory Visit: Payer: BLUE CROSS/BLUE SHIELD

## 2014-10-27 ENCOUNTER — Other Ambulatory Visit (HOSPITAL_BASED_OUTPATIENT_CLINIC_OR_DEPARTMENT_OTHER): Payer: BLUE CROSS/BLUE SHIELD

## 2014-10-27 DIAGNOSIS — C6291 Malignant neoplasm of right testis, unspecified whether descended or undescended: Secondary | ICD-10-CM | POA: Diagnosis not present

## 2014-10-27 DIAGNOSIS — C6290 Malignant neoplasm of unspecified testis, unspecified whether descended or undescended: Secondary | ICD-10-CM

## 2014-10-27 LAB — CBC WITH DIFFERENTIAL/PLATELET
BASO%: 0.6 % (ref 0.0–2.0)
BASOS ABS: 0 10*3/uL (ref 0.0–0.1)
EOS ABS: 0.1 10*3/uL (ref 0.0–0.5)
EOS%: 1.4 % (ref 0.0–7.0)
HCT: 42.1 % (ref 38.4–49.9)
HEMOGLOBIN: 14.5 g/dL (ref 13.0–17.1)
LYMPH#: 1.9 10*3/uL (ref 0.9–3.3)
LYMPH%: 36.3 % (ref 14.0–49.0)
MCH: 31.5 pg (ref 27.2–33.4)
MCHC: 34.4 g/dL (ref 32.0–36.0)
MCV: 91.3 fL (ref 79.3–98.0)
MONO#: 0.5 10*3/uL (ref 0.1–0.9)
MONO%: 9.8 % (ref 0.0–14.0)
NEUT%: 51.9 % (ref 39.0–75.0)
NEUTROS ABS: 2.7 10*3/uL (ref 1.5–6.5)
Platelets: 153 10*3/uL (ref 140–400)
RBC: 4.61 10*6/uL (ref 4.20–5.82)
RDW: 15.5 % — ABNORMAL HIGH (ref 11.0–14.6)
WBC: 5.1 10*3/uL (ref 4.0–10.3)

## 2015-01-05 ENCOUNTER — Ambulatory Visit (HOSPITAL_BASED_OUTPATIENT_CLINIC_OR_DEPARTMENT_OTHER): Payer: BLUE CROSS/BLUE SHIELD | Admitting: Oncology

## 2015-01-05 ENCOUNTER — Telehealth: Payer: Self-pay | Admitting: Oncology

## 2015-01-05 ENCOUNTER — Other Ambulatory Visit (HOSPITAL_BASED_OUTPATIENT_CLINIC_OR_DEPARTMENT_OTHER): Payer: BLUE CROSS/BLUE SHIELD

## 2015-01-05 ENCOUNTER — Ambulatory Visit: Payer: BLUE CROSS/BLUE SHIELD

## 2015-01-05 ENCOUNTER — Encounter: Payer: Self-pay | Admitting: Nurse Practitioner

## 2015-01-05 VITALS — BP 148/69 | HR 62 | Temp 98.4°F | Resp 18 | Ht 68.0 in | Wt 171.5 lb

## 2015-01-05 DIAGNOSIS — D696 Thrombocytopenia, unspecified: Secondary | ICD-10-CM | POA: Diagnosis not present

## 2015-01-05 DIAGNOSIS — R109 Unspecified abdominal pain: Secondary | ICD-10-CM | POA: Diagnosis not present

## 2015-01-05 DIAGNOSIS — C6291 Malignant neoplasm of right testis, unspecified whether descended or undescended: Secondary | ICD-10-CM

## 2015-01-05 DIAGNOSIS — C6290 Malignant neoplasm of unspecified testis, unspecified whether descended or undescended: Secondary | ICD-10-CM

## 2015-01-05 LAB — CBC WITH DIFFERENTIAL/PLATELET
BASO%: 1.1 % (ref 0.0–2.0)
BASOS ABS: 0.1 10*3/uL (ref 0.0–0.1)
EOS ABS: 0.3 10*3/uL (ref 0.0–0.5)
EOS%: 3.9 % (ref 0.0–7.0)
HCT: 45.5 % (ref 38.4–49.9)
HEMOGLOBIN: 15 g/dL (ref 13.0–17.1)
LYMPH#: 2.7 10*3/uL (ref 0.9–3.3)
LYMPH%: 35.4 % (ref 14.0–49.0)
MCH: 31.4 pg (ref 27.2–33.4)
MCHC: 33 g/dL (ref 32.0–36.0)
MCV: 95.1 fL (ref 79.3–98.0)
MONO#: 0.7 10*3/uL (ref 0.1–0.9)
MONO%: 9.7 % (ref 0.0–14.0)
NEUT%: 49.9 % (ref 39.0–75.0)
NEUTROS ABS: 3.8 10*3/uL (ref 1.5–6.5)
PLATELETS: 124 10*3/uL — AB (ref 140–400)
RBC: 4.78 10*6/uL (ref 4.20–5.82)
RDW: 13.7 % (ref 11.0–14.6)
WBC: 7.6 10*3/uL (ref 4.0–10.3)

## 2015-01-05 LAB — COMPREHENSIVE METABOLIC PANEL (CC13)
ALT: 19 U/L (ref 0–55)
ANION GAP: 9 meq/L (ref 3–11)
AST: 14 U/L (ref 5–34)
Albumin: 4.1 g/dL (ref 3.5–5.0)
Alkaline Phosphatase: 66 U/L (ref 40–150)
BUN: 12.8 mg/dL (ref 7.0–26.0)
CO2: 24 mEq/L (ref 22–29)
Calcium: 9.1 mg/dL (ref 8.4–10.4)
Chloride: 107 mEq/L (ref 98–109)
Creatinine: 1.1 mg/dL (ref 0.7–1.3)
EGFR: 77 mL/min/{1.73_m2} — AB (ref >90)
Glucose: 123 mg/dl (ref 70–140)
POTASSIUM: 3.7 meq/L (ref 3.5–5.1)
SODIUM: 140 meq/L (ref 136–145)
Total Bilirubin: 0.34 mg/dL (ref 0.20–1.20)
Total Protein: 6.9 g/dL (ref 6.4–8.3)

## 2015-01-05 LAB — LACTATE DEHYDROGENASE (CC13): LDH: 144 U/L (ref 125–245)

## 2015-01-05 NOTE — Telephone Encounter (Signed)
per pof to sch pt appt-gave pt copy of avs-adv [facility] will call and set appt-per Colletta [place]  in office in [facility]

## 2015-01-05 NOTE — Progress Notes (Signed)
Hematology and Oncology Follow Up Visit  [name] [id number] 12/08/1965 49 y.o. 01/05/2015 2:55 PM No PCP Per PatientNo ref. provider found   Principle Diagnosis: 49 year old gentleman with right testicle seminoma diagnosed in March 2016. 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   Prior Therapy:  He is status post right orchiectomy done on 08/01/2014. Carboplatin adjuvant chemotherapy given at AUC of 7 first dose given on 09/22/2014. The second dose was given on 10/13/2014.   Current therapy: Observation and surveillance.  Interim History:  [name] returns today for a follow-up visit. Since the last visit, he completed 2 cycles of adjuvant chemotherapy without any complications. He did have some mild nausea but no delayed effects. He did not have any bleeding complications. He is complaining of increased abdominal distention and early satiety. He is feeling bloated with constant pressure-like sensation in the middle of the abdomen after completing his meals. He had similar symptoms in the past and did have a workup including right upper quadrant ultrasound as well as nuclear medicine study to evaluate his gallbladder. His workup was unrevealing. He had a colonoscopy many years ago for bleeding per rectum but no recent GI workup. His appetite remains reasonable but he is afraid to eat. He has not lost any weight. His performance status and activity level remain about the same.  He does not report any headaches, blurry vision, syncope or seizures. He does not report any fevers, chills, sweats. He does not report any chest pain, palpitation or orthopnea. He does not report any cough or hemoptysis or hematemesis. He is not report any nausea, vomiting,  constipation or diarrhea. He does not report any frequency, urgency or hesitancy. He does report erectile dysfunction. He does not report any arthralgias or myalgias. He does not report any anxiety or depression. Remaining  review of systems unremarkable.    Medications: I have reviewed the patient's current medications.  Current Outpatient Prescriptions  Medication Sig Dispense Refill  . alfuzosin (UROXATRAL) 10 MG 24 hr tablet Take 10 mg by mouth daily.  11  . ALPRAZolam (XANAX) 1 MG tablet Take 1 mg by mouth at bedtime.    . finasteride (PROPECIA) 1 MG tablet Take 1 mg by mouth daily.    . traZODone (DESYREL) 50 MG tablet Take 50 mg by mouth at bedtime.    . Vortioxetine HBr (BRINTELLIX) 20 MG TABS Take 20 mg by mouth daily.     No current facility-administered medications for this visit.     Allergies:  Allergies  Allergen Reactions  . Penicillins Hives    Past Medical History, Surgical history, Social history, and Family History were reviewed and updated.   Physical Exam: Blood pressure 148/69, pulse 62, temperature 98.4 F (36.9 C), temperature source Oral, resp. rate 18, height 5\' 8"  (1.727 m), weight 171 lb 8 oz (77.792 kg), SpO2 100 %. ECOG: 0 General appearance: alert and cooperative appeared in no active distress. Head: Normocephalic, without obvious abnormality Neck: no adenopathy Lymph nodes: Cervical, supraclavicular, and axillary nodes normal. Heart:regular rate and rhythm, S1, S2 normal, no murmur, click, rub or gallop Lung:chest clear, no wheezing, rales, normal symmetric air entry Abdomin: soft, non-tender, without masses or organomegaly no rebound or guarding. No shifting dullness or ascites. EXT:no erythema, induration, or nodules   Lab Results: Lab Results  Component Value Date   WBC 7.6 01/05/2015   HGB 15.0 01/05/2015   HCT 45.5 01/05/2015   MCV 95.1 01/05/2015   PLT  124* 01/05/2015     Chemistry      Component Value Date/Time   NA 141 10/13/2014 0845   NA 141 03/26/2011 1142   K 3.7 10/13/2014 0845   K 3.9 03/26/2011 1142   CL 102 03/26/2011 1142   CO2 22 10/13/2014 0845   CO2 25 07/26/2010 2040   BUN 10.3 10/13/2014 0845   BUN 8 03/26/2011 1142    CREATININE 0.9 10/13/2014 0845   CREATININE 1.30 03/26/2011 1142      Component Value Date/Time   CALCIUM 8.6 10/13/2014 0845   CALCIUM 9.0 07/26/2010 2040   ALKPHOS 66 10/13/2014 0845   ALKPHOS 54 07/26/2010 2040   AST 19 10/13/2014 0845   AST 29 07/26/2010 2040   ALT 20 10/13/2014 0845   ALT 31 07/26/2010 2040   BILITOT 0.42 10/13/2014 0845   BILITOT 0.5 07/26/2010 2040          Impression and Plan:    49 year old gentleman with the following issues:  1. 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 He is status post right orchiectomy done on 08/01/2014. The pathology showed a T2 NX. Seminoma measuring 6 cm with lymphovascular invasion.   He is status post adjuvant carboplatin at AUC of 7 for a total of 2 cycles completed in May 2016. He has no ill effects associated with this therapy and the plan is to continue with active surveillance. I will repeat his CT scan chest abdomen and pelvis in the near future to rule out malignancy. After that he will have that repeated every 6 months for the first 2 years. Then annually after that..  2. Abdominal pain and early satiety: Unclear etiology his pain is predominantly post prandial. He does not have history of peptic ulcer disease but certainly warrants a GI workup. I doubt this is malignancy related as he had a CT scan in March 2016 that did not show any abdominal malignancy. I will refer him to gastroenterology for workup regarding these symptoms.  3. Thrombocytopenia: Related to systemic chemotherapy appear to have nearly normalized. He has no bleeding episodes. We will continue to monitor this.  4. Follow-up: Will be in 3 months sooner if his imaging studies showed any malignancy.   Zola Button, MD 8/12/20162:55 PM

## 2015-01-09 LAB — BETA HCG QUANT (REF LAB): Beta hCG, Tumor Marker: <2 m[IU]/mL (ref <5.0)

## 2015-01-09 LAB — AFP TUMOR MARKER: AFP TUMOR MARKER: 3.2 ng/mL (ref <6.1)

## 2015-01-12 ENCOUNTER — Encounter (HOSPITAL_COMMUNITY): Payer: Self-pay

## 2015-01-12 ENCOUNTER — Ambulatory Visit (HOSPITAL_COMMUNITY)
Admission: RE | Admit: 2015-01-12 | Discharge: 2015-01-12 | Disposition: A | Discharge status: Home / Self Care | Payer: BLUE CROSS/BLUE SHIELD | Source: Ambulatory Visit | Attending: Oncology | Admitting: Oncology

## 2015-01-12 DIAGNOSIS — I7090 Unspecified atherosclerosis: Secondary | ICD-10-CM | POA: Insufficient documentation

## 2015-01-12 DIAGNOSIS — N200 Calculus of kidney: Secondary | ICD-10-CM | POA: Insufficient documentation

## 2015-01-12 DIAGNOSIS — R1033 Periumbilical pain: Secondary | ICD-10-CM | POA: Diagnosis not present

## 2015-01-12 DIAGNOSIS — R109 Unspecified abdominal pain: Secondary | ICD-10-CM

## 2015-01-12 DIAGNOSIS — Z9079 Acquired absence of other genital organ(s): Secondary | ICD-10-CM | POA: Diagnosis not present

## 2015-01-12 DIAGNOSIS — K760 Fatty (change of) liver, not elsewhere classified: Secondary | ICD-10-CM | POA: Diagnosis not present

## 2015-01-12 DIAGNOSIS — Z9221 Personal history of antineoplastic chemotherapy: Secondary | ICD-10-CM | POA: Diagnosis not present

## 2015-01-12 DIAGNOSIS — R079 Chest pain, unspecified: Secondary | ICD-10-CM | POA: Insufficient documentation

## 2015-01-12 DIAGNOSIS — D1803 Hemangioma of intra-abdominal structures: Secondary | ICD-10-CM | POA: Diagnosis not present

## 2015-01-12 DIAGNOSIS — C6290 Malignant neoplasm of unspecified testis, unspecified whether descended or undescended: Secondary | ICD-10-CM | POA: Insufficient documentation

## 2015-01-12 HISTORY — DX: Malignant (primary) neoplasm, unspecified: C80.1

## 2015-01-12 MED ORDER — IOHEXOL 300 MG/ML  SOLN
100.0000 mL | Freq: Once | INTRAMUSCULAR | Status: AC | PRN
  Administered 2015-01-12: 100 mL via INTRAVENOUS

## 2015-01-15 ENCOUNTER — Telehealth: Payer: Self-pay | Admitting: *Deleted

## 2015-01-15 NOTE — Telephone Encounter (Signed)
-----   Message from Wyatt Portela, MD sent at 01/12/2015  4:44 PM EDT ----- Please let him know the scans are normal

## 2015-01-15 NOTE — Telephone Encounter (Signed)
As noted below by Dr. Alen Blew, I informed the patient that his scans were normal. Patient verbalized understanding.

## 2015-01-30 ENCOUNTER — Ambulatory Visit (INDEPENDENT_AMBULATORY_CARE_PROVIDER_SITE_OTHER): Payer: BLUE CROSS/BLUE SHIELD | Admitting: Nurse Practitioner

## 2015-01-30 ENCOUNTER — Encounter: Payer: Self-pay | Admitting: Nurse Practitioner

## 2015-01-30 VITALS — BP 128/78 | HR 68 | Ht 68.0 in | Wt 169.0 lb

## 2015-01-30 DIAGNOSIS — R194 Change in bowel habit: Secondary | ICD-10-CM | POA: Diagnosis not present

## 2015-01-30 DIAGNOSIS — R140 Abdominal distension (gaseous): Secondary | ICD-10-CM

## 2015-01-30 MED ORDER — RIFAXIMIN 550 MG PO TABS: 550.0000 mg | ORAL_TABLET | Freq: Three times a day (TID) | ORAL | Status: AC

## 2015-01-30 NOTE — Patient Instructions (Signed)
We sent a prescription to [facility]) for Xifaxan 550 mg.  Take 1 capful of Miralax , mix in 8 oz of water daily. Use Glycerin suppositories as needed.  We have given you a pamphlet  On Bloating and gas.

## 2015-01-30 NOTE — Progress Notes (Signed)
HPI :  Patient is a 49 year old male referred by Oncologiy for bloating and early satiety. Patient was evaluated (? by PCP) for similar symptoms one year ago. Abdominal ultrasound revealed steatosis and a right liver lobe hemangioma. HIDA scan was normal. 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    Patient complains of constant bloating. He has early satiety without weight loss. He also complains of incomplete evacuation with daily bowel movements. Patient has taken Metamucil every night for at least 10 years. No recent medication changes to account for bowel changes. No blood in stool. Patient thinks he had a colonoscopy 5 or so years ago, maybe with [facility]. Labs mid August including comprehensive metabolic profile and CBC were unremarkable with the exception of low platelet count of 124. Recent CTscan again revealed steatosis and a right hepatic lobe hemangioma. Scan otherwise unrevealing.  Past Medical History  Diagnosis Date  . Sleep apnea     no cpap, unable to tolerate mask  . Heart murmur   . Headache     tension h/a's  . Testicular mass     rt sided  . Wears glasses     for computer work  . testicular ca dx'd 07/2014    right    Family History  Problem Relation Age of Onset  . Heart disease Father    Social History  Substance Use Topics  . Smoking status: Current Some Day Smoker  . Smokeless tobacco: Never Used  . Alcohol Use: 7.2 oz/week    12 Cans of beer per week   Current Outpatient Prescriptions  Medication Sig Dispense Refill  . ALPRAZolam (XANAX) 1 MG tablet Take 1 mg by mouth at bedtime.    . finasteride (PROPECIA) 1 MG tablet Take 1 mg by mouth daily.    . traZODone (DESYREL) 50 MG tablet Take 50 mg by mouth at bedtime.    . Vortioxetine HBr (BRINTELLIX) 20 MG TABS Take 20 mg by mouth daily.     No current facility-administered medications for this visit.   Allergies  Allergen Reactions    . Penicillins Hives    Review of Systems: Positive for anxiety, back pain, vision changes, depression, fatigue, and sleeping problems. All other systems reviewed and negative except where noted in HPI.    Ct Abdomen Pelvis W Contrast  01/12/2015   CLINICAL DATA:  Testicular cancer diagnosed in March 2016. Chemotherapy completed 3 months ago. Mid chest and periumbilical pain for several months. Subsequent encounter.  EXAM: CT CHEST, ABDOMEN, AND PELVIS WITH CONTRAST  TECHNIQUE: Multidetector CT imaging of the chest, abdomen and pelvis was performed following the standard protocol during bolus administration of intravenous contrast.  CONTRAST:  127mL OMNIPAQUE IOHEXOL 300 MG/ML  SOLN  COMPARISON:  CTs 03/26/2011 and 07/26/2014.  FINDINGS: CT CHEST FINDINGS  Mediastinum/Nodes: There are no enlarged mediastinal, hilar or axillary lymph nodes. The thyroid gland, trachea and esophagus demonstrate no significant findings. The heart size is normal. There is no pericardial effusion. There are no significant vascular findings.  Lungs/Pleura: There is no pleural effusion.The lungs are clear.  Musculoskeletal/Chest wall: No chest wall mass or suspicious osseous findings.  CT ABDOMEN AND PELVIS FINDINGS  Hepatobiliary: Diffuse hepatic steatosis again noted. Central enhancing lesion in the right hepatic lobe measuring approximately 3.5 x 3.7 cm on image 49 is unchanged. This is homogeneous and isodense to blood pool on the delayed phase images, consistent with a hemangioma. The other smaller hemangioma  noted more posteriorly in the right hepatic lobe is not as well seen, but grossly stable. No new or enlarging liver lesions. No evidence of gallstones, gallbladder wall thickening or biliary dilatation.  Pancreas: Unremarkable. No pancreatic ductal dilatation or surrounding inflammatory changes.  Spleen: Normal in size without focal abnormality.  Adrenals/Urinary Tract: Both adrenal glands appear normal. There is a tiny  nonobstructing calculus in the lower pole of the left kidney. No evidence of ureteral or bladder calculus. Tiny low-density lesion in the lower pole of the right kidney appears unchanged. No evidence of renal mass or hydronephrosis. The bladder appears unremarkable.  Stomach/Bowel: No evidence of bowel wall thickening, distention or surrounding inflammatory change.  Vascular/Lymphatic: There are no enlarged abdominal or pelvic lymph nodes. Stable minimal atherosclerosis.  Reproductive: The prostate gland appears stable with small central dystrophic calcifications. The seminal vesicles appear normal. Presumed interval right orchiectomy with placement of a testicular prosthesis.  Other: No evidence of abdominal wall mass or hernia.  Musculoskeletal: No acute or significant osseous findings.  IMPRESSION: 1. Interval right orchiectomy. No evidence of retroperitoneal lymphadenopathy or other signs of metastatic disease. 2. Stable hepatic hemangiomas. 3. Nonobstructing left renal calculus. 4. Negative chest CT.   Electronically Signed   By: Richardean Sale M.D.   On: 01/12/2015 15:43    Physical Exam: BP 128/78 mmHg  Pulse 68  Ht 5\' 8"  (1.727 m)  Wt 169 lb (76.658 kg)  BMI 25.70 kg/m2 Constitutional: Pleasant,well-developed, white male in no acute distress. HEENT: Normocephalic and atraumatic. Conjunctivae are normal. No scleral icterus. Neck supple.  Cardiovascular: Normal rate, regular rhythm.  Pulmonary/chest: Effort normal and breath sounds normal. No wheezing, rales or rhonchi. Abdominal: Soft, nondistended, nontender. Bowel sounds active throughout. There are no masses palpable. No hepatomegaly. Extremities: no edema Lymphadenopathy: No cervical adenopathy noted. Neurological: Alert and oriented to person place and time. Skin: Skin is warm and dry. No rashes noted. Psychiatric: Normal mood and affect. Behavior is normal.   ASSESSMENT AND PLAN:  71. 49 year old male with bloating and sensation  of incomplete evacuation. Patient may have irritable bowel syndrome. The bloating is constant, uncomfortable. Recent labs and CTscan unrevelaing  Trial of Xifaxan 550mg  TID x 14 days  Glycerin supp prn constipation  Brochure on gas / bloating  Obtain colonoscopy report  Follow up with [place] in 4-6 weeks.   Colonoscopy will be based on findings of previous colonoscopy and / or resolution of current bowel habit problems.   2. 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    Cc: Zola Button, MD

## 2015-01-31 DIAGNOSIS — R194 Change in bowel habit: Secondary | ICD-10-CM | POA: Insufficient documentation

## 2015-01-31 DIAGNOSIS — R140 Abdominal distension (gaseous): Secondary | ICD-10-CM | POA: Insufficient documentation

## 2015-02-02 NOTE — Progress Notes (Signed)
Reviewed and agree with management. Lyda Colcord D. Shaquel Chavous, M.D., FACG  

## 2015-02-07 ENCOUNTER — Telehealth: Payer: Self-pay | Admitting: Gastroenterology

## 2015-02-07 NOTE — Telephone Encounter (Signed)
[name], have you seen a prior auth form on this patient for [name], It looks like you was primarying [name] when this was prescribed. I have not seen a prior auth for [employer]

## 2015-02-08 NOTE — Telephone Encounter (Signed)
Spoke to a representative and I was told Gabriel Cirri is working on a prior authorization on this medication.  She will call Marisue Humble CMA when she has completed it.

## 2015-02-12 ENCOUNTER — Telehealth: Payer: Self-pay | Admitting: Gastroenterology

## 2015-02-12 DIAGNOSIS — K529 Noninfective gastroenteritis and colitis, unspecified: Secondary | ICD-10-CM

## 2015-02-12 DIAGNOSIS — R140 Abdominal distension (gaseous): Secondary | ICD-10-CM

## 2015-02-12 DIAGNOSIS — R194 Change in bowel habit: Secondary | ICD-10-CM

## 2015-02-14 ENCOUNTER — Telehealth: Payer: Self-pay | Admitting: *Deleted

## 2015-02-14 NOTE — Telephone Encounter (Signed)
-----   Message from Inda Castle, MD sent at 02/13/2015 11:33 AM EDT ----- We need to get hold of his prior colonoscopy report.  Bentyl is the wrong medication for abdominal bloating.  We can hold on xifaxan for right now. ----- Message -----    From: Tonette Bihari, CMA    Sent: 02/13/2015  10:05 AM      To: Inda Castle, MD  Dr. Deatra Ina, Tye Savoy NP saw this patient on 01-30-2015.  He was having bowel changes, bloating.  She prescribed Xifaxan 550 mg.  I sent it to [facility] and his [place] won't approve it.  They said he needed to try and fail Bentyl, any antispasmodic.  They also asked if he had a condraindication to anti-depressants.   You were supervising that day. Tag your it!!  LOL.   Just let me know , once you review her note if you want to prescribe anything else.   Thanks,  Marisue Humble CMA

## 2015-02-14 NOTE — Telephone Encounter (Signed)
I called the patient to advise I am in contact with Dr. Erskine Emery who supervised Kyle Savoy NP the day he saw her in the office, 01-30-2015.  I advised she is not working in our office now and Dr. Deatra Ina will be his physiciam .  I told the patient I got Dr. Deatra Ina a copy of the colonoscopy from 2007 from [place].  I told the patient I will get back to him once Dr. Deatra Ina reviews this information. Since the patient is not able to get the Xifaxan, we may prescribe something else.

## 2015-02-15 ENCOUNTER — Encounter: Payer: Self-pay | Admitting: Gastroenterology

## 2015-02-15 NOTE — Progress Notes (Unsigned)
Patient ID: [name], male   DOB: 12-18-1965, 49 y.o.   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   There is no EGD report but a small bowel biopsy demonstrated focal blunting of villi the small intestine with chronic inflammation raising the possibility of celiac disease.  Plan to check celiac serologies.  We'll also obtain a gastric emptying scan.

## 2015-02-15 NOTE — Telephone Encounter (Signed)
Called patient to advise Dr. Deatra [name] ordered Celiac blood work and a gastric Emptying Scan.  Told patient to come to our lab, basement level tomorrow or early next week.  Also he can go to [place], [place] [facility] entrance on 02-22-2015 . He can arrive at 10:45 am.  He should have nothing by mouth 6-8 hours prior to the test.  Advised patient we will call him with results.

## 2015-02-19 ENCOUNTER — Telehealth: Payer: Self-pay | Admitting: *Deleted

## 2015-02-19 NOTE — Telephone Encounter (Signed)
Called patient on Thursday 02-15-2015.  Advised him Dr. Deatra Ina would like him to come to our lab,orders are in, for celiac testing.  Also, we ordered a Gastric Emptying Scan at [facility] for 02-22-2015 at 11 :00 am.  He is to arrive at 10:45 am.  He can go to [facility] front entrance go to registration to Radiology.  NPO after 5 :00 am.  ( NPO for 6-8 hours.).

## 2015-02-21 ENCOUNTER — Telehealth: Payer: Self-pay | Admitting: Nurse Practitioner

## 2015-02-21 NOTE — Telephone Encounter (Signed)
Rescheduled the test for the patient to 03/08/15 arrive at 6:45 am. Phone number to Scheduling given to the patient.

## 2015-02-22 ENCOUNTER — Ambulatory Visit (HOSPITAL_COMMUNITY): Payer: BLUE CROSS/BLUE SHIELD

## 2015-02-28 ENCOUNTER — Encounter: Payer: Self-pay | Admitting: Gastroenterology

## 2015-02-28 NOTE — Progress Notes (Unsigned)
Patient ID: [name], male   DOB: July 27, 1965, 49 y.o.   MRN: [id number] [facility] 2007 showed nonadenomatous polyp.  Path from [facility] showed focal blunting and chronic inflammation.    Needs celiac serologies.  If negative, colo with examination of ileum

## 2015-03-08 ENCOUNTER — Ambulatory Visit (HOSPITAL_COMMUNITY): Payer: BLUE CROSS/BLUE SHIELD

## 2015-03-23 ENCOUNTER — Telehealth: Payer: Self-pay

## 2015-03-23 NOTE — Telephone Encounter (Signed)
I have left message for the patient to call back 

## 2015-04-04 ENCOUNTER — Telehealth: Payer: Self-pay | Admitting: Oncology

## 2015-04-04 NOTE — Telephone Encounter (Signed)
Patient called to r/s 11/11 appointments for 12/20.

## 2015-04-06 ENCOUNTER — Other Ambulatory Visit: Payer: BLUE CROSS/BLUE SHIELD

## 2015-04-06 ENCOUNTER — Ambulatory Visit: Payer: BLUE CROSS/BLUE SHIELD | Admitting: Oncology

## 2015-05-15 ENCOUNTER — Encounter: Payer: Self-pay | Admitting: *Deleted

## 2015-05-15 ENCOUNTER — Other Ambulatory Visit (HOSPITAL_BASED_OUTPATIENT_CLINIC_OR_DEPARTMENT_OTHER): Payer: BLUE CROSS/BLUE SHIELD

## 2015-05-15 ENCOUNTER — Ambulatory Visit (HOSPITAL_BASED_OUTPATIENT_CLINIC_OR_DEPARTMENT_OTHER): Payer: BLUE CROSS/BLUE SHIELD | Admitting: Oncology

## 2015-05-15 ENCOUNTER — Telehealth: Payer: Self-pay | Admitting: Oncology

## 2015-05-15 VITALS — BP 139/73 | HR 78 | Temp 98.6°F | Resp 18 | Ht 68.0 in | Wt 168.4 lb

## 2015-05-15 DIAGNOSIS — C6292 Malignant neoplasm of left testis, unspecified whether descended or undescended: Secondary | ICD-10-CM

## 2015-05-15 DIAGNOSIS — C6290 Malignant neoplasm of unspecified testis, unspecified whether descended or undescended: Secondary | ICD-10-CM

## 2015-05-15 DIAGNOSIS — C6291 Malignant neoplasm of right testis, unspecified whether descended or undescended: Secondary | ICD-10-CM | POA: Diagnosis not present

## 2015-05-15 DIAGNOSIS — R109 Unspecified abdominal pain: Secondary | ICD-10-CM

## 2015-05-15 LAB — COMPREHENSIVE METABOLIC PANEL
ALT: 19 U/L (ref 0–55)
AST: 19 U/L (ref 5–34)
Albumin: 4.1 g/dL (ref 3.5–5.0)
Alkaline Phosphatase: 73 U/L (ref 40–150)
Anion Gap: 9 mEq/L (ref 3–11)
BUN: 12.3 mg/dL (ref 7.0–26.0)
CHLORIDE: 103 meq/L (ref 98–109)
CO2: 28 meq/L (ref 22–29)
CREATININE: 1.1 mg/dL (ref 0.7–1.3)
Calcium: 9.5 mg/dL (ref 8.4–10.4)
EGFR: 80 mL/min/{1.73_m2} — ABNORMAL LOW (ref >90)
GLUCOSE: 90 mg/dL (ref 70–140)
POTASSIUM: 4.1 meq/L (ref 3.5–5.1)
SODIUM: 140 meq/L (ref 136–145)
Total Bilirubin: 0.33 mg/dL (ref 0.20–1.20)
Total Protein: 7.7 g/dL (ref 6.4–8.3)

## 2015-05-15 LAB — CBC WITH DIFFERENTIAL/PLATELET
BASO%: 0.7 % (ref 0.0–2.0)
Basophils Absolute: 0.1 10*3/uL (ref 0.0–0.1)
EOS%: 2.6 % (ref 0.0–7.0)
Eosinophils Absolute: 0.2 10*3/uL (ref 0.0–0.5)
HEMATOCRIT: 48 % (ref 38.4–49.9)
HGB: 15.7 g/dL (ref 13.0–17.1)
LYMPH#: 2.6 10*3/uL (ref 0.9–3.3)
LYMPH%: 34.2 % (ref 14.0–49.0)
MCH: 28.5 pg (ref 27.2–33.4)
MCHC: 32.8 g/dL (ref 32.0–36.0)
MCV: 86.9 fL (ref 79.3–98.0)
MONO#: 1 10*3/uL — ABNORMAL HIGH (ref 0.1–0.9)
MONO%: 12.4 % (ref 0.0–14.0)
NEUT%: 50.1 % (ref 39.0–75.0)
NEUTROS ABS: 3.8 10*3/uL (ref 1.5–6.5)
Platelets: 149 10*3/uL (ref 140–400)
RBC: 5.53 10*6/uL (ref 4.20–5.82)
RDW: 13.5 % (ref 11.0–14.6)
WBC: 7.7 10*3/uL (ref 4.0–10.3)

## 2015-05-15 LAB — LACTATE DEHYDROGENASE: LDH: 160 U/L (ref 125–245)

## 2015-05-15 NOTE — Progress Notes (Signed)
Hematology and Oncology Follow Up Visit  [name] [id number] [birth year] 49 y.o. 05/15/2015 4:17 PM No PCP Per PatientNo ref. provider found   Principle Diagnosis: 49 year old gentleman with right testicle seminoma diagnosed in March 2016. 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   Prior Therapy:  He is status post right orchiectomy done on 08/01/2014. Carboplatin adjuvant chemotherapy given at AUC of 7 first dose given on 09/22/2014. The second dose was given on 10/13/2014.   Current therapy: Observation and surveillance.  Interim History: [name] presents today for a follow-up visit by himself. Since the last visit, he reports feeling well without any complaints. He does not report any delayed complications related to chemotherapy. He did not any GI complications such as nausea, abdominal pain or change in his bowel habits. He was seen by gastroenterology and he is not due for colonoscopy at this time. He is able to work and perform activities of daily living. He has not lost any weight. His performance status and activity level remain about the same. Has not reported any lymphadenopathy in the neck or inguinal area.  He does not report any headaches, blurry vision, syncope or seizures. He does not report any fevers, chills, sweats. He does not report any chest pain, palpitation or orthopnea. He does not report any cough or hemoptysis or hematemesis. He is not report any nausea, vomiting,  constipation or diarrhea. He does not report any frequency, urgency or hesitancy. He does report erectile dysfunction. He does not report any arthralgias or myalgias. He does not report any anxiety or depression. Remaining review of systems unremarkable.    Medications: I have reviewed the patient's current medications.  Current Outpatient Prescriptions  Medication Sig Dispense Refill  . ALPRAZolam (XANAX) 1 MG tablet Take 1 mg by mouth at bedtime.    . finasteride  (PROPECIA) 1 MG tablet Take 1 mg by mouth daily.    . traZODone (DESYREL) 50 MG tablet Take 50 mg by mouth at bedtime.    . Vortioxetine HBr (BRINTELLIX) 20 MG TABS Take 20 mg by mouth daily.     No current facility-administered medications for this visit.     Allergies:  Allergies  Allergen Reactions  . Penicillins Hives    Past Medical History, Surgical history, Social history, and Family History were reviewed and updated.   Physical Exam: Blood pressure 139/73, pulse 78, temperature 98.6 F (37 C), temperature source Oral, resp. rate 18, height 5\' 8"  (1.727 m), weight 168 lb 6.4 oz (76.386 kg), SpO2 99 %. ECOG: 0 General appearance: alert and cooperative in no distress. Head: Normocephalic, without obvious abnormality Neck: no adenopathy Lymph nodes: Cervical, supraclavicular, and axillary nodes normal. Heart:regular rate and rhythm, S1, S2 normal, no murmur, click, rub or gallop no leg edema. Lung:chest clear, no wheezing, rales, normal symmetric air entry Abdomin: soft, non-tender, without masses or organomegaly no shifting dullness or ascites. EXT:no erythema, induration, or nodules   Lab Results: Lab Results  Component Value Date   WBC 7.7 05/15/2015   HGB 15.7 05/15/2015   HCT 48.0 05/15/2015   MCV 86.9 05/15/2015   PLT 149 05/15/2015     Chemistry      Component Value Date/Time   NA 140 05/15/2015 1525   NA 141 03/26/2011 1142   K 4.1 05/15/2015 1525   K 3.9 03/26/2011 1142   CL 102 03/26/2011 1142   CO2 28 05/15/2015 1525   CO2 25 07/26/2010 2040  BUN 12.3 05/15/2015 1525   BUN 8 03/26/2011 1142   CREATININE 1.1 05/15/2015 1525   CREATININE 1.30 03/26/2011 1142      Component Value Date/Time   CALCIUM 9.5 05/15/2015 1525   CALCIUM 9.0 07/26/2010 2040   ALKPHOS 73 05/15/2015 1525   ALKPHOS 54 07/26/2010 2040   AST 19 05/15/2015 1525   AST 29 07/26/2010 2040   ALT 19 05/15/2015 1525   ALT 31 07/26/2010 2040   BILITOT 0.33 05/15/2015 1525    BILITOT 0.5 07/26/2010 2040       EXAM: CT CHEST, ABDOMEN, AND PELVIS WITH CONTRAST  TECHNIQUE: Multidetector CT imaging of the chest, abdomen and pelvis was performed following the standard protocol during bolus administration of intravenous contrast.  CONTRAST: 166mL OMNIPAQUE IOHEXOL 300 MG/ML SOLN  COMPARISON: CTs 03/26/2011 and 07/26/2014.  FINDINGS: CT CHEST FINDINGS  Mediastinum/Nodes: There are no enlarged mediastinal, hilar or axillary lymph nodes. The thyroid gland, trachea and esophagus demonstrate no significant findings. The heart size is normal. There is no pericardial effusion. There are no significant vascular findings.  Lungs/Pleura: There is no pleural effusion.The lungs are clear.  Musculoskeletal/Chest wall: No chest wall mass or suspicious osseous findings.  CT ABDOMEN AND PELVIS FINDINGS  Hepatobiliary: Diffuse hepatic steatosis again noted. Central enhancing lesion in the right hepatic lobe measuring approximately 3.5 x 3.7 cm on image 49 is unchanged. This is homogeneous and isodense to blood pool on the delayed phase images, consistent with a hemangioma. The other smaller hemangioma noted more posteriorly in the right hepatic lobe is not as well seen, but grossly stable. No new or enlarging liver lesions. No evidence of gallstones, gallbladder wall thickening or biliary dilatation.  Pancreas: Unremarkable. No pancreatic ductal dilatation or surrounding inflammatory changes.  Spleen: Normal in size without focal abnormality.  Adrenals/Urinary Tract: Both adrenal glands appear normal. There is a tiny nonobstructing calculus in the lower pole of the left kidney. No evidence of ureteral or bladder calculus. Tiny low-density lesion in the lower pole of the right kidney appears unchanged. No evidence of renal mass or hydronephrosis. The bladder appears unremarkable.  Stomach/Bowel: No evidence of bowel wall thickening, distention  or surrounding inflammatory change.  Vascular/Lymphatic: There are no enlarged abdominal or pelvic lymph nodes. Stable minimal atherosclerosis.  Reproductive: The prostate gland appears stable with small central dystrophic calcifications. The seminal vesicles appear normal. Presumed interval right orchiectomy with placement of a testicular prosthesis.  Other: No evidence of abdominal wall mass or hernia.  Musculoskeletal: No acute or significant osseous findings.  IMPRESSION: 1. Interval right orchiectomy. No evidence of retroperitoneal lymphadenopathy or other signs of metastatic disease. 2. Stable hepatic hemangiomas. 3. Nonobstructing left renal calculus. 4. Negative chest CT.   Impression and Plan:    49 year old gentleman with the following issues:  1. 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 He is status post right orchiectomy done on 08/01/2014. The pathology showed a T2 NX. Seminoma measuring 6 cm with lymphovascular invasion.   He is status post adjuvant carboplatin at AUC of 7 for a total of 2 cycles completed in May 2016.   He is currently on observation and surveillance with routine physical examination, laboratory testing and imaging studies. He will have a CT scan in March 2017 and repeated every 6 months for the first 2 years. This will be repeated annually at that time.  2. Abdominal pain and early satiety:  CT scan in August 2016 did  not show any abnormalities and the symptoms have resolved at this time. He also follows up with gastroenterology regarding this issue.   3. Thrombocytopenia: Related to systemic chemotherapy and back within normal range at this time.   4. Follow-up: Will be in 3 months  repeat imaging studies at that time.    Zola Button, MD 12/20/20164:17 PM

## 2015-05-15 NOTE — Telephone Encounter (Signed)
Scheduled appt with patient while he was here in person at [facility] Scheduling office.       [facility]. °

## 2015-05-18 LAB — AFP TUMOR MARKER: AFP TUMOR MARKER: 3.5 ng/mL (ref <6.1)

## 2015-05-18 LAB — BETA HCG QUANT (REF LAB): Beta hCG, Tumor Marker: <2 m[IU]/mL (ref <5.0)

## 2015-08-14 ENCOUNTER — Other Ambulatory Visit (HOSPITAL_BASED_OUTPATIENT_CLINIC_OR_DEPARTMENT_OTHER): Payer: BLUE CROSS/BLUE SHIELD

## 2015-08-14 DIAGNOSIS — C6292 Malignant neoplasm of left testis, unspecified whether descended or undescended: Secondary | ICD-10-CM

## 2015-08-14 LAB — COMPREHENSIVE METABOLIC PANEL
ALBUMIN: 4.1 g/dL (ref 3.5–5.0)
ALK PHOS: 62 U/L (ref 40–150)
ALT: 23 U/L (ref 0–55)
ANION GAP: 7 meq/L (ref 3–11)
AST: 22 U/L (ref 5–34)
BILIRUBIN TOTAL: 0.43 mg/dL (ref 0.20–1.20)
BUN: 10.8 mg/dL (ref 7.0–26.0)
CALCIUM: 9.2 mg/dL (ref 8.4–10.4)
CO2: 30 mEq/L — ABNORMAL HIGH (ref 22–29)
Chloride: 105 mEq/L (ref 98–109)
Creatinine: 1.1 mg/dL (ref 0.7–1.3)
EGFR: 76 mL/min/{1.73_m2} — AB (ref >90)
GLUCOSE: 94 mg/dL (ref 70–140)
POTASSIUM: 4.5 meq/L (ref 3.5–5.1)
SODIUM: 142 meq/L (ref 136–145)
TOTAL PROTEIN: 7.5 g/dL (ref 6.4–8.3)

## 2015-08-14 LAB — CBC WITH DIFFERENTIAL/PLATELET
BASO%: 1.2 % (ref 0.0–2.0)
BASOS ABS: 0.1 10*3/uL (ref 0.0–0.1)
EOS ABS: 0.2 10*3/uL (ref 0.0–0.5)
EOS%: 3.8 % (ref 0.0–7.0)
HCT: 49 % (ref 38.4–49.9)
HEMOGLOBIN: 16.1 g/dL (ref 13.0–17.1)
LYMPH%: 35 % (ref 14.0–49.0)
MCH: 28.7 pg (ref 27.2–33.4)
MCHC: 32.8 g/dL (ref 32.0–36.0)
MCV: 87.6 fL (ref 79.3–98.0)
MONO#: 0.6 10*3/uL (ref 0.1–0.9)
MONO%: 10.3 % (ref 0.0–14.0)
NEUT%: 49.7 % (ref 39.0–75.0)
NEUTROS ABS: 3 10*3/uL (ref 1.5–6.5)
PLATELETS: 146 10*3/uL (ref 140–400)
RBC: 5.59 10*6/uL (ref 4.20–5.82)
RDW: 13.3 % (ref 11.0–14.6)
WBC: 6.1 10*3/uL (ref 4.0–10.3)
lymph#: 2.1 10*3/uL (ref 0.9–3.3)

## 2015-08-14 LAB — LACTATE DEHYDROGENASE: LDH: 144 U/L (ref 125–245)

## 2015-08-15 LAB — AFP TUMOR MARKER: AFP, Serum, Tumor Marker: 4 ng/mL (ref 0.0–8.3)

## 2015-08-15 LAB — BETA HCG QUANT (REF LAB)

## 2015-08-17 LAB — ALPHA FETO PROTEIN (PARALLEL TESTING): AFP-Tumor Marker: 3.3 ng/mL (ref <6.1)

## 2015-08-17 LAB — BETA HCG, QN (PARALLEL TESTING): Beta hCG, Tumor Marker: <2 m[IU]/mL (ref <5.0)

## 2015-08-22 ENCOUNTER — Ambulatory Visit (HOSPITAL_BASED_OUTPATIENT_CLINIC_OR_DEPARTMENT_OTHER): Payer: BLUE CROSS/BLUE SHIELD | Admitting: Oncology

## 2015-08-22 ENCOUNTER — Telehealth: Payer: Self-pay | Admitting: Oncology

## 2015-08-22 VITALS — BP 144/70 | HR 85 | Temp 98.6°F | Resp 18 | Ht 68.0 in | Wt 173.0 lb

## 2015-08-22 DIAGNOSIS — C6292 Malignant neoplasm of left testis, unspecified whether descended or undescended: Secondary | ICD-10-CM

## 2015-08-22 DIAGNOSIS — D696 Thrombocytopenia, unspecified: Secondary | ICD-10-CM | POA: Diagnosis not present

## 2015-08-22 DIAGNOSIS — C6291 Malignant neoplasm of right testis, unspecified whether descended or undescended: Secondary | ICD-10-CM | POA: Diagnosis not present

## 2015-08-22 NOTE — Progress Notes (Signed)
Hematology and Oncology Follow Up Visit  [name] [id number] May 28, 1965 50 y.o. 08/22/2015 3:20 PM No PCP Per PatientNo ref. 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   Prior Therapy:  He is status post right orchiectomy done on 08/01/2014. Carboplatin adjuvant chemotherapy given at AUC of 7 first dose given on 09/22/2014. The second dose was given on 10/13/2014.   Current therapy: Observation and surveillance.  Interim History: Mr. Fiorito presents today for a follow-up visit by himself. Since the last visit, he reports no major changes in his health. He does report some occasional urinary difficulties including weakness and numbness stream and nocturia. He also had some sleeping difficulties and insomnia that has been chronic in nature.  He is able to work and perform activities of daily living. He has not lost any weight. His performance status and activity level remain about the same. Has not reported any lymphadenopathy in the neck or inguinal area. Has not reported any respiratory symptoms such as coughing or difficulty breathing.  He does not report any headaches, blurry vision, syncope or seizures. He does not report any fevers, chills, sweats. He does not report any chest pain, palpitation or orthopnea. He does not report any cough or hemoptysis or hematemesis. He is not report any nausea, vomiting,  constipation or diarrhea. He does not report any frequency, urgency or hesitancy. He does report erectile dysfunction. He does not report any arthralgias or myalgias. He does not report any anxiety or depression. Remaining review of systems unremarkable.    Medications: I have reviewed the patient's current medications.  Current Outpatient Prescriptions  Medication Sig Dispense Refill  . ALPRAZolam (XANAX) 1 MG tablet Take 2 mg by mouth at  bedtime.     . finasteride (PROPECIA) 1 MG tablet Take 1 mg by mouth daily.    . traZODone (DESYREL) 50 MG tablet Take 50 mg by mouth at bedtime.     No current facility-administered medications for this visit.     Allergies:  Allergies  Allergen Reactions  . Penicillins Hives    Past Medical History, Surgical history, Social history, and Family History were reviewed and updated.   Physical Exam: Blood pressure 144/70, pulse 85, temperature 98.6 F (37 C), temperature source Oral, resp. rate 18, height 5\' 8"  (1.727 m), weight 173 lb (78.472 kg), SpO2 100 %. ECOG: 0 General appearance: alert and cooperative well-appearing. Head: Normocephalic, without obvious abnormality no oral ulcers or lesions. Neck: no adenopathy Lymph nodes: Cervical, supraclavicular, and axillary nodes normal. Heart:regular rate and rhythm, S1, S2 normal, no murmur, click, rub or gallop no leg edema. Lung:chest clear, no wheezing, rales, normal symmetric air entry Abdomin: soft, non-tender, without masses or organomegaly no rebound or guarding. EXT:no erythema, induration, or nodules   Lab Results: Lab Results  Component Value Date   WBC 6.1 08/14/2015   HGB 16.1 08/14/2015   HCT 49.0 08/14/2015   MCV 87.6 08/14/2015   PLT 146 08/14/2015     Chemistry      Component Value Date/Time   NA 142 08/14/2015 1237   NA 141 03/26/2011 1142   K 4.5 08/14/2015 1237   K 3.9 03/26/2011 1142   CL 102 03/26/2011 1142   CO2 30* 08/14/2015 1237   CO2 25 07/26/2010 2040   BUN 10.8 08/14/2015 1237   BUN 8 03/26/2011 1142   CREATININE 1.1 08/14/2015  1237   CREATININE 1.30 03/26/2011 1142      Component Value Date/Time   CALCIUM 9.2 08/14/2015 1237   CALCIUM 9.0 07/26/2010 2040   ALKPHOS 62 08/14/2015 1237   ALKPHOS 54 07/26/2010 2040   AST 22 08/14/2015 1237   AST 29 07/26/2010 2040   ALT 23 08/14/2015 1237   ALT 31 07/26/2010 2040   BILITOT 0.43 08/14/2015 1237   BILITOT 0.5 07/26/2010 2040         Impression and Plan:    50 year old gentleman with the following issues:  1. 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 He is status post right orchiectomy done on 08/01/2014. The pathology showed a T2 NX. Seminoma measuring 6 cm with lymphovascular invasion.   He is status post adjuvant carboplatin at AUC of 7 for a total of 2 cycles completed in May 2016.   He is currently on observation and surveillance with routine physical examination, laboratory testing and imaging studies. The plan is to continue with follow-up every 3 months and repeat imaging studies every 6 months for the first 2 years. He is due for a CT scan in March 2017 but we elected to defer that to June 2017. After that he will receive it every 6 months as scheduled.  2. Abdominal pain and early satiety:  CT scan in August 2016 did not show any abnormalities and the symptoms have resolved.   3. Thrombocytopenia: Related to systemic chemotherapy and back within normal range at this time.   4. Follow-up: Will be in 3 months  repeat imaging studies at that time.    Zola Button, MD 3/29/20173:20 PM

## 2015-08-22 NOTE — Telephone Encounter (Signed)
per pof to sch pt appt-adv pt [facility] all to sch scan-gave contrast

## 2015-11-20 ENCOUNTER — Ambulatory Visit (HOSPITAL_COMMUNITY)
Admission: RE | Admit: 2015-11-20 | Discharge: 2015-11-20 | Disposition: A | Discharge status: Home / Self Care | Payer: BLUE CROSS/BLUE SHIELD | Source: Ambulatory Visit | Attending: Oncology | Admitting: Oncology

## 2015-11-20 ENCOUNTER — Other Ambulatory Visit (HOSPITAL_BASED_OUTPATIENT_CLINIC_OR_DEPARTMENT_OTHER): Payer: BLUE CROSS/BLUE SHIELD

## 2015-11-20 ENCOUNTER — Encounter (HOSPITAL_COMMUNITY): Payer: Self-pay

## 2015-11-20 DIAGNOSIS — D1803 Hemangioma of intra-abdominal structures: Secondary | ICD-10-CM | POA: Diagnosis not present

## 2015-11-20 DIAGNOSIS — K760 Fatty (change of) liver, not elsewhere classified: Secondary | ICD-10-CM | POA: Diagnosis not present

## 2015-11-20 DIAGNOSIS — C6292 Malignant neoplasm of left testis, unspecified whether descended or undescended: Secondary | ICD-10-CM | POA: Diagnosis not present

## 2015-11-20 DIAGNOSIS — N200 Calculus of kidney: Secondary | ICD-10-CM | POA: Insufficient documentation

## 2015-11-20 LAB — CBC WITH DIFFERENTIAL/PLATELET
BASO%: 0.9 % (ref 0.0–2.0)
Basophils Absolute: 0.1 10*3/uL (ref 0.0–0.1)
EOS ABS: 0.2 10*3/uL (ref 0.0–0.5)
EOS%: 3.3 % (ref 0.0–7.0)
HEMATOCRIT: 49.8 % (ref 38.4–49.9)
HGB: 16.4 g/dL (ref 13.0–17.1)
LYMPH#: 2.2 10*3/uL (ref 0.9–3.3)
LYMPH%: 30.9 % (ref 14.0–49.0)
MCH: 29.1 pg (ref 27.2–33.4)
MCHC: 32.8 g/dL (ref 32.0–36.0)
MCV: 88.5 fL (ref 79.3–98.0)
MONO#: 0.7 10*3/uL (ref 0.1–0.9)
MONO%: 10.3 % (ref 0.0–14.0)
NEUT#: 3.9 10*3/uL (ref 1.5–6.5)
NEUT%: 54.6 % (ref 39.0–75.0)
PLATELETS: 124 10*3/uL — AB (ref 140–400)
RBC: 5.63 10*6/uL (ref 4.20–5.82)
RDW: 13.3 % (ref 11.0–14.6)
WBC: 7.2 10*3/uL (ref 4.0–10.3)

## 2015-11-20 LAB — COMPREHENSIVE METABOLIC PANEL
ALT: 26 U/L (ref 0–55)
ANION GAP: 8 meq/L (ref 3–11)
AST: 21 U/L (ref 5–34)
Albumin: 4 g/dL (ref 3.5–5.0)
Alkaline Phosphatase: 67 U/L (ref 40–150)
BILIRUBIN TOTAL: 0.47 mg/dL (ref 0.20–1.20)
BUN: 12.4 mg/dL (ref 7.0–26.0)
CALCIUM: 9.4 mg/dL (ref 8.4–10.4)
CHLORIDE: 105 meq/L (ref 98–109)
CO2: 27 meq/L (ref 22–29)
CREATININE: 1.1 mg/dL (ref 0.7–1.3)
EGFR: 82 mL/min/{1.73_m2} — AB (ref >90)
Glucose: 81 mg/dl (ref 70–140)
Potassium: 4.5 mEq/L (ref 3.5–5.1)
Sodium: 140 mEq/L (ref 136–145)
TOTAL PROTEIN: 7.4 g/dL (ref 6.4–8.3)

## 2015-11-20 MED ORDER — IOPAMIDOL (ISOVUE-300) INJECTION 61%
100.0000 mL | Freq: Once | INTRAVENOUS | Status: AC | PRN
  Administered 2015-11-20: 100 mL via INTRAVENOUS

## 2015-11-21 LAB — BETA HCG QUANT (REF LAB): hCG Quant: <1 m[IU]/mL (ref 0–3)

## 2015-11-23 LAB — BETA HCG, QN (PARALLEL TESTING): Beta hCG, Tumor Marker: <2 m[IU]/mL (ref <5.0)

## 2015-11-25 ENCOUNTER — Telehealth: Payer: Self-pay | Admitting: Oncology

## 2015-11-25 NOTE — Telephone Encounter (Signed)
PAL - moved 7/5 f/u to 7/13. Confirmed with patient.

## 2015-11-28 ENCOUNTER — Ambulatory Visit: Payer: BLUE CROSS/BLUE SHIELD | Admitting: Oncology

## 2015-12-06 ENCOUNTER — Ambulatory Visit (HOSPITAL_BASED_OUTPATIENT_CLINIC_OR_DEPARTMENT_OTHER): Payer: BLUE CROSS/BLUE SHIELD | Admitting: Oncology

## 2015-12-06 ENCOUNTER — Telehealth: Payer: Self-pay | Admitting: Oncology

## 2015-12-06 VITALS — BP 131/66 | HR 74 | Temp 98.2°F | Resp 18 | Ht 68.0 in | Wt 176.7 lb

## 2015-12-06 DIAGNOSIS — C6291 Malignant neoplasm of right testis, unspecified whether descended or undescended: Secondary | ICD-10-CM

## 2015-12-06 DIAGNOSIS — C6292 Malignant neoplasm of left testis, unspecified whether descended or undescended: Secondary | ICD-10-CM

## 2015-12-06 NOTE — Progress Notes (Signed)
Hematology and Oncology Follow Up Visit  [name] [id number] 05/04/1966 50 y.o. 12/06/2015 3:38 PM No PCP Per PatientNo ref. 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   Prior Therapy:  He is status post right orchiectomy done on 08/01/2014. Carboplatin adjuvant chemotherapy given at AUC of 7 first dose given on 09/22/2014. The second dose was given on 10/13/2014.   Current therapy: Observation and surveillance.  Interim History: [name] presents today for a follow-up visit by himself. Since the last visit, he reports doing well without any recent complaints. He did have one episode of chest pain and difficulty breathing and was evaluated in the emergency department with his workup was unrevealing. He was attributed likely due to anxiety.   He denies any lymphadenopathy or constitutional symptoms. He is able to work and perform activities of daily living. He has not lost any weight. His performance status and activity level remain about the same. His appetite remains excellent. He continues to work full time without any decline in his energy.  He does not report any headaches, blurry vision, syncope or seizures. He does not report any fevers, chills, sweats. He does not report any chest pain, palpitation or orthopnea. He does not report any cough or hemoptysis or hematemesis. He is not report any nausea, vomiting,  constipation or diarrhea. He does report erectile dysfunction. He does not report any arthralgias or myalgias. He does not report any anxiety or depression. Remaining review of systems unremarkable.    Medications: I have reviewed the patient's current medications.  Current Outpatient Prescriptions  Medication Sig Dispense Refill  . ALPRAZolam (XANAX) 1 MG tablet Take 2 mg by mouth at bedtime.     . finasteride (PROPECIA) 1 MG tablet  Take 1 mg by mouth daily.    Marland Kitchen zolpidem (AMBIEN) 10 MG tablet Take 1 tablet by mouth at bedtime.     No current facility-administered medications for this visit.     Allergies:  Allergies  Allergen Reactions  . Penicillins Hives    Past Medical History, Surgical history, Social history, and Family History were reviewed and updated.   Physical Exam: Blood pressure 131/66, pulse 74, temperature 98.2 F (36.8 C), temperature source Oral, resp. rate 18, height 5\' 8"  (1.727 m), weight 176 lb 11.2 oz (80.151 kg), SpO2 100 %. ECOG: 0 General appearance: Alert, awake gentleman without distress. Head: Normocephalic, without obvious abnormality no oral thrush noted. Neck: no adenopathy Lymph nodes: Cervical, supraclavicular, and axillary nodes normal. Heart:regular rate and rhythm, S1, S2 normal, no murmur, click, rub or gallop no leg edema. Lung:chest clear, no wheezing, rales, normal symmetric air entry Abdomin: soft, non-tender, without masses or organomegaly no shifting dullness or ascites. EXT:no erythema, induration, or nodules   Lab Results: Lab Results  Component Value Date   WBC 7.2 11/20/2015   HGB 16.4 11/20/2015   HCT 49.8 11/20/2015   MCV 88.5 11/20/2015   PLT 124* 11/20/2015     Chemistry      Component Value Date/Time   NA 140 11/20/2015 0919   NA 141 03/26/2011 1142   K 4.5 11/20/2015 0919   K 3.9 03/26/2011 1142   CL 102 03/26/2011 1142   CO2 27 11/20/2015 0919   CO2 25 07/26/2010 2040   BUN 12.4 11/20/2015 0919   BUN 8 03/26/2011 1142   CREATININE 1.1 11/20/2015 0919  CREATININE 1.30 03/26/2011 1142      Component Value Date/Time   CALCIUM 9.4 11/20/2015 0919   CALCIUM 9.0 07/26/2010 2040   ALKPHOS 67 11/20/2015 0919   ALKPHOS 54 07/26/2010 2040   AST 21 11/20/2015 0919   AST 29 07/26/2010 2040   ALT 26 11/20/2015 0919   ALT 31 07/26/2010 2040   BILITOT 0.47 11/20/2015 0919   BILITOT 0.5 07/26/2010 2040      EXAM: CT CHEST, ABDOMEN, AND  PELVIS WITH CONTRAST  TECHNIQUE: Multidetector CT imaging of the chest, abdomen and pelvis was performed following the standard protocol during bolus administration of intravenous contrast.  CONTRAST: 163mL ISOVUE-300 IOPAMIDOL (ISOVUE-300) INJECTION 61%  COMPARISON: 01/12/2015.  FINDINGS: CT CHEST FINDINGS  Mediastinum/Lymph Nodes: There is no axillary lymphadenopathy. No mediastinal lymphadenopathy. There is no hilar lymphadenopathy. The heart size is normal. No pericardial effusion. The esophagus has normal imaging features.  Lungs/Pleura: Lungs are clear bilaterally without pulmonary nodule or mass. There is some trace atelectasis in the dependent lower lobes. No focal consolidation. No pulmonary edema or pleural effusion.  Musculoskeletal: Bone windows reveal no worrisome lytic or sclerotic osseous lesions.  CT ABDOMEN PELVIS FINDINGS  Hepatobiliary: The liver shows diffusely decreased attenuation suggesting steatosis. 3.7 cm cavernous hemangioma again identified in the central liver. A second smaller vascular lesion in the posterior right hepatic dome is stable. No new or worrisome lesion within the hepatic parenchyma. There is no evidence for gallstones, gallbladder wall thickening, or pericholecystic fluid. No intrahepatic or extrahepatic biliary dilation.  Pancreas: No focal mass lesion. No dilatation of the main duct. No intraparenchymal cyst. No peripancreatic edema.  Spleen: No splenomegaly. No focal mass lesion.  Adrenals/Urinary Tract: No adrenal nodule or mass. Tiny hypodensities lower pole right kidney unchanged and probably a tiny cyst although technically too small to characterize. Tiny nonobstructing lower pole calculus again noted with left kidney otherwise unremarkable. No evidence for hydroureter. The urinary bladder appears normal for the degree of distention.  Stomach/Bowel: Stomach is nondistended. No gastric wall  thickening. No evidence of outlet obstruction. Duodenum diverticulum noted. No small bowel wall thickening. No small bowel dilatation. The terminal ileum is normal. The appendix is normal. No gross colonic mass. No colonic wall thickening. No substantial diverticular change.  Vascular/Lymphatic: No abdominal aortic aneurysm. No abdominal aortic atherosclerotic calcification. There is no gastrohepatic or hepatoduodenal ligament lymphadenopathy. No intraperitoneal or retroperitoneal lymphadenopathy.  Reproductive: The prostate gland and seminal vesicles have normal imaging features. Prosthesis noted in the right hemiscrotum.  Other: No intraperitoneal free fluid.  Musculoskeletal: Bone windows reveal no worrisome lytic or sclerotic osseous lesions.  IMPRESSION: 1. Stable exam. No new or progressive findings to suggest metastatic disease. 2. Hepatic steatosis with cavernous hemangiomata. 3. Stable tiny lower pole left renal stone.   Impression and Plan:    50 year old gentleman with the following issues:  1. 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 He is status post right orchiectomy done on 08/01/2014. The pathology showed a T2 NX. Seminoma measuring 6 cm with lymphovascular invasion.   He is status post adjuvant carboplatin at AUC of 7 for a total of 2 cycles completed in May 2016. He is currently on observation and surveillance with routine physical examination, laboratory testing and imaging studies.    CT scan and laboratory testing on 11/20/2015 were reviewed and discussed with the patient. He has no evidence of recurrent disease at this time. The plan is to continue with  active surveillance with clinical visit, laboratory testing and imaging studies every 6 months for the next year. In your 3 through 5 he will have annual imaging studies only.  2 Thrombocytopenia: Related to systemic chemotherapy and back  within normal range at this time.   3.Follow-up: Will be in 6 months for a clinical visit as well as discuss laboratory data and CT scan.   Zola Button, MD 7/13/20173:38 PM

## 2015-12-06 NOTE — Telephone Encounter (Signed)
per pof ot sch pt appt-gave pt copy of avs °

## 2016-06-02 ENCOUNTER — Telehealth: Payer: Self-pay | Admitting: Oncology

## 2016-06-02 NOTE — Telephone Encounter (Signed)
spoke with patient to advise that appointment time had been changed from 06/14/15 to 06/20/16 at 1:15pm. also advised pt that he can pick up contrast from scheduling dept

## 2016-06-06 ENCOUNTER — Other Ambulatory Visit (HOSPITAL_BASED_OUTPATIENT_CLINIC_OR_DEPARTMENT_OTHER): Payer: BLUE CROSS/BLUE SHIELD

## 2016-06-06 ENCOUNTER — Ambulatory Visit (HOSPITAL_COMMUNITY)
Admission: RE | Admit: 2016-06-06 | Discharge: 2016-06-06 | Disposition: A | Discharge status: Home / Self Care | Payer: BLUE CROSS/BLUE SHIELD | Source: Ambulatory Visit | Attending: Oncology | Admitting: Oncology

## 2016-06-06 DIAGNOSIS — D1803 Hemangioma of intra-abdominal structures: Secondary | ICD-10-CM | POA: Diagnosis not present

## 2016-06-06 DIAGNOSIS — C6292 Malignant neoplasm of left testis, unspecified whether descended or undescended: Secondary | ICD-10-CM

## 2016-06-06 DIAGNOSIS — K760 Fatty (change of) liver, not elsewhere classified: Secondary | ICD-10-CM | POA: Diagnosis not present

## 2016-06-06 LAB — COMPREHENSIVE METABOLIC PANEL
ALT: 21 U/L (ref 0–55)
AST: 18 U/L (ref 5–34)
Albumin: 3.9 g/dL (ref 3.5–5.0)
Alkaline Phosphatase: 68 U/L (ref 40–150)
Anion Gap: 10 mEq/L (ref 3–11)
BILIRUBIN TOTAL: 0.55 mg/dL (ref 0.20–1.20)
BUN: 11.1 mg/dL (ref 7.0–26.0)
CO2: 25 meq/L (ref 22–29)
CREATININE: 1 mg/dL (ref 0.7–1.3)
Calcium: 9.3 mg/dL (ref 8.4–10.4)
Chloride: 105 mEq/L (ref 98–109)
EGFR: 84 mL/min/{1.73_m2} — ABNORMAL LOW (ref >90)
GLUCOSE: 96 mg/dL (ref 70–140)
Potassium: 4.2 mEq/L (ref 3.5–5.1)
SODIUM: 140 meq/L (ref 136–145)
TOTAL PROTEIN: 6.8 g/dL (ref 6.4–8.3)

## 2016-06-06 LAB — CBC WITH DIFFERENTIAL/PLATELET
BASO%: 1.2 % (ref 0.0–2.0)
Basophils Absolute: 0.1 10*3/uL (ref 0.0–0.1)
EOS%: 2.7 % (ref 0.0–7.0)
Eosinophils Absolute: 0.2 10*3/uL (ref 0.0–0.5)
HCT: 45.5 % (ref 38.4–49.9)
HGB: 15.3 g/dL (ref 13.0–17.1)
LYMPH%: 27.3 % (ref 14.0–49.0)
MCH: 29.1 pg (ref 27.2–33.4)
MCHC: 33.6 g/dL (ref 32.0–36.0)
MCV: 86.9 fL (ref 79.3–98.0)
MONO#: 0.6 10*3/uL (ref 0.1–0.9)
MONO%: 8.3 % (ref 0.0–14.0)
NEUT%: 60.5 % (ref 39.0–75.0)
NEUTROS ABS: 4.1 10*3/uL (ref 1.5–6.5)
PLATELETS: 129 10*3/uL — AB (ref 140–400)
RBC: 5.24 10*6/uL (ref 4.20–5.82)
RDW: 14.1 % (ref 11.0–14.6)
WBC: 6.8 10*3/uL (ref 4.0–10.3)
lymph#: 1.9 10*3/uL (ref 0.9–3.3)

## 2016-06-06 LAB — LACTATE DEHYDROGENASE: LDH: 149 U/L (ref 125–245)

## 2016-06-06 MED ORDER — IOPAMIDOL (ISOVUE-300) INJECTION 61%
100.0000 mL | Freq: Once | INTRAVENOUS | Status: AC | PRN
  Administered 2016-06-06: 100 mL via INTRAVENOUS

## 2016-06-07 LAB — ALPHA FETO PROTEIN (PARALLEL TESTING): AFP-Tumor Marker: 3.5 ng/mL (ref <6.1)

## 2016-06-07 LAB — BETA HCG QUANT (REF LAB): hCG Quant: <1 m[IU]/mL (ref 0–3)

## 2016-06-07 LAB — AFP TUMOR MARKER: AFP, Serum, Tumor Marker: 3.6 ng/mL (ref 0.0–8.3)

## 2016-06-13 ENCOUNTER — Ambulatory Visit: Payer: BLUE CROSS/BLUE SHIELD | Admitting: Oncology

## 2016-06-14 ENCOUNTER — Other Ambulatory Visit: Payer: Self-pay | Admitting: Nurse Practitioner

## 2016-06-20 ENCOUNTER — Telehealth: Payer: Self-pay | Admitting: Oncology

## 2016-06-20 ENCOUNTER — Ambulatory Visit (HOSPITAL_BASED_OUTPATIENT_CLINIC_OR_DEPARTMENT_OTHER): Payer: BLUE CROSS/BLUE SHIELD | Admitting: Oncology

## 2016-06-20 VITALS — BP 129/72 | HR 70 | Temp 98.2°F | Resp 18 | Ht 68.0 in | Wt 175.8 lb

## 2016-06-20 DIAGNOSIS — C6292 Malignant neoplasm of left testis, unspecified whether descended or undescended: Secondary | ICD-10-CM

## 2016-06-20 DIAGNOSIS — D696 Thrombocytopenia, unspecified: Secondary | ICD-10-CM | POA: Diagnosis not present

## 2016-06-20 DIAGNOSIS — C6291 Malignant neoplasm of right testis, unspecified whether descended or undescended: Secondary | ICD-10-CM

## 2016-06-20 NOTE — Telephone Encounter (Signed)
Appointments scheduled per 06/20/16 los. Patient was given a copy of the AVS report and appointment schedule per 06/20/16 los.  ° °

## 2016-06-20 NOTE — Progress Notes (Signed)
Hematology and Oncology Follow Up Visit  [name] [id number] 1965/08/14 51 y.o. 06/20/2016 1:13 PM No PCP Per PatientNo ref. 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   Prior Therapy:  He is status post right orchiectomy done on 08/01/2014. Carboplatin adjuvant chemotherapy given at AUC of 7 first dose given on 09/22/2014. The second dose was given on 10/13/2014.   Current therapy: Observation and surveillance.  Interim History: [name] presents today for a follow-up visit by himself. Since the last visit, he continues to do well without any recent issues. He denies any lymphadenopathy or constitutional symptoms. He denied any pelvic pain or penile discharge. He is able to work and perform activities of daily living. His performance status and activity level remain about the same. His appetite remains unchanged and his weight is stable.  He does not report any headaches, blurry vision, syncope or seizures. He does not report any fevers, chills, sweats. He does not report any chest pain, palpitation or orthopnea. He does not report any cough or hemoptysis or hematemesis. He is not report any nausea, vomiting,  constipation or diarrhea. He does report erectile dysfunction. He does not report any arthralgias or myalgias. He does not report any anxiety or depression. Remaining review of systems unremarkable.    Medications: I have reviewed the patient's current medications.  Current Outpatient Prescriptions  Medication Sig Dispense Refill  . alfuzosin (UROXATRAL) 10 MG 24 hr tablet Take 1 tablet by mouth daily.    Marland Kitchen ALPRAZolam (XANAX) 1 MG tablet Take 2 mg by mouth at bedtime.     . finasteride (PROPECIA) 1 MG tablet Take 1 mg by mouth daily.    . TRINTELLIX 20 MG TABS Take 1 tablet by mouth daily.    Marland Kitchen zolpidem (AMBIEN) 10 MG tablet Take 1 tablet  by mouth at bedtime.     No current facility-administered medications for this visit.      Allergies:  Allergies  Allergen Reactions  . Penicillins Hives    Past Medical History, Surgical history, Social history, and Family History were reviewed and updated.   Physical Exam: Blood pressure 129/72, pulse 70, temperature 98.2 F (36.8 C), temperature source Oral, resp. rate 18, height 5\' 8"  (1.727 m), weight 175 lb 12.8 oz (79.7 kg), SpO2 100 %. ECOG: 0 General appearance: Well-appearing gentleman without distress. Head: Normocephalic, without obvious abnormality no oral ulcers or thrush. Neck: no adenopathy Lymph nodes: Cervical, supraclavicular, and axillary nodes normal. Heart:regular rate and rhythm, S1, S2 normal, no murmur, click, rub or gallop Lung:chest clear, no wheezing, rales, normal symmetric air entry Abdomin: soft, non-tender, without masses or organomegaly no rebound or guarding. EXT:no erythema, induration, or nodules   Lab Results: Lab Results  Component Value Date   WBC 6.8 06/06/2016   HGB 15.3 06/06/2016   HCT 45.5 06/06/2016   MCV 86.9 06/06/2016   PLT 129 (L) 06/06/2016     Chemistry      Component Value Date/Time   NA 140 06/06/2016 0846   K 4.2 06/06/2016 0846   CL 102 03/26/2011 1142   CO2 25 06/06/2016 0846   BUN 11.1 06/06/2016 0846   CREATININE 1.0 06/06/2016 0846      Component Value Date/Time   CALCIUM 9.3 06/06/2016 0846   ALKPHOS 68 06/06/2016 0846   AST 18 06/06/2016 0846   ALT 21 06/06/2016 0846  BILITOT 0.55 06/06/2016 0846    Results for [name], [name] (MRN [id number]) as of 06/20/2016 12:44  Ref. Range 06/06/2016 08:46  hCG Quant Latest Ref Range: 0 - 3 mIU/mL <1  AFP Tumor Marker Latest Ref Range: <6.1 ng/mL 3.5  AFP, Serum, Tumor Marker Latest Ref Range: 0.0 - 8.3 ng/mL 3.6    EXAM: CT CHEST, ABDOMEN, AND PELVIS WITH CONTRAST  TECHNIQUE: Multidetector CT imaging of the chest, abdomen and pelvis was performed  following the standard protocol during bolus administration of intravenous contrast.  CONTRAST:  156mL ISOVUE-300 IOPAMIDOL (ISOVUE-300) INJECTION 61%  COMPARISON:  11/20/2015  FINDINGS: CT CHEST FINDINGS  Cardiovascular: No acute findings.  Mediastinum/Lymph Nodes: No masses or pathologically enlarged lymph nodes identified.  Lungs/Pleura: No pulmonary infiltrate or mass identified. No suspicious pulmonary nodules. Stable 3 mm nodule in the anterior right middle lobe compared to previous studies dating back to 2016, consistent with benign etiology. No effusion present.  Musculoskeletal:  No suspicious bone lesions identified.  CT ABDOMEN AND PELVIS FINDINGS  Hepatobiliary: Stable severe hepatic steatosis. Stable benign hepatic hemangiomas, largest measuring 3.5 cm. No new or enlarging hepatic masses identified. Gallbladder is unremarkable.  Pancreas:  No mass or inflammatory changes.  Spleen:  Within normal limits in size and appearance.  Adrenals/Urinary tract: No masses or hydronephrosis. Unopacified urinary bladder is unremarkable in appearance.  Stomach/Bowel: No evidence of obstruction, inflammatory process, or abnormal fluid collections.  Vascular/Lymphatic: No pathologically enlarged lymph nodes identified. No abdominal aortic aneurysm.  Reproductive: No mass or other significant abnormality identified. Postop changes from right orchiectomy again noted.  Other:  None.  Musculoskeletal:  No suspicious bone lesions identified.  IMPRESSION: Stable exam. No evidence of metastatic disease or other acute findings.  Stable severe hepatic steatosis and benign hepatic hemangiomas.   Impression and Plan:    51 year old gentleman with the following issues:  1. 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 He is status post right orchiectomy done on 08/01/2014. The pathology showed a  T2 NX. Seminoma measuring 6 cm with lymphovascular invasion.   He is status post adjuvant carboplatin at AUC of 7 for a total of 2 cycles completed in May 2016. He is currently on observation and surveillance with routine physical examination, laboratory testing and imaging studies.    CT scan and laboratory testing from 06/06/2016 were reviewed and discussed with the patient. He has no evidence of recurrent disease at this time.   The plan is to continue with active surveillance with clinical visit, laboratory testing every 6 months for the next 2 years. He will have repeat imaging studies annually and year 3 to year 5.  2 Thrombocytopenia: Related to systemic chemotherapy and back close to normal range.   3.Follow-up: Will be in 6 months for a clinical visit and laboratory testing.   Zola Button, MD 1/26/20181:13 PM

## 2016-12-19 ENCOUNTER — Ambulatory Visit (HOSPITAL_BASED_OUTPATIENT_CLINIC_OR_DEPARTMENT_OTHER): Payer: BLUE CROSS/BLUE SHIELD | Admitting: Oncology

## 2016-12-19 ENCOUNTER — Other Ambulatory Visit (HOSPITAL_BASED_OUTPATIENT_CLINIC_OR_DEPARTMENT_OTHER): Payer: BLUE CROSS/BLUE SHIELD

## 2016-12-19 VITALS — BP 130/66 | HR 72 | Temp 98.8°F | Resp 18 | Ht 68.0 in | Wt 182.2 lb

## 2016-12-19 DIAGNOSIS — R140 Abdominal distension (gaseous): Secondary | ICD-10-CM

## 2016-12-19 DIAGNOSIS — D696 Thrombocytopenia, unspecified: Secondary | ICD-10-CM

## 2016-12-19 DIAGNOSIS — C6292 Malignant neoplasm of left testis, unspecified whether descended or undescended: Secondary | ICD-10-CM

## 2016-12-19 DIAGNOSIS — C6291 Malignant neoplasm of right testis, unspecified whether descended or undescended: Secondary | ICD-10-CM

## 2016-12-19 DIAGNOSIS — R1310 Dysphagia, unspecified: Secondary | ICD-10-CM

## 2016-12-19 LAB — CBC WITH DIFFERENTIAL/PLATELET
BASO%: 0.8 % (ref 0.0–2.0)
BASOS ABS: 0.1 10*3/uL (ref 0.0–0.1)
EOS%: 3.2 % (ref 0.0–7.0)
Eosinophils Absolute: 0.2 10*3/uL (ref 0.0–0.5)
HCT: 48.2 % (ref 38.4–49.9)
HEMOGLOBIN: 16.1 g/dL (ref 13.0–17.1)
LYMPH%: 30.5 % (ref 14.0–49.0)
MCH: 29.8 pg (ref 27.2–33.4)
MCHC: 33.4 g/dL (ref 32.0–36.0)
MCV: 89.3 fL (ref 79.3–98.0)
MONO#: 0.7 10*3/uL (ref 0.1–0.9)
MONO%: 9.4 % (ref 0.0–14.0)
NEUT#: 4 10*3/uL (ref 1.5–6.5)
NEUT%: 56.1 % (ref 39.0–75.0)
Platelets: 139 10*3/uL — ABNORMAL LOW (ref 140–400)
RBC: 5.4 10*6/uL (ref 4.20–5.82)
RDW: 13.6 % (ref 11.0–14.6)
WBC: 7.1 10*3/uL (ref 4.0–10.3)
lymph#: 2.2 10*3/uL (ref 0.9–3.3)

## 2016-12-19 LAB — COMPREHENSIVE METABOLIC PANEL
ALBUMIN: 4 g/dL (ref 3.5–5.0)
ALK PHOS: 71 U/L (ref 40–150)
ALT: 26 U/L (ref 0–55)
AST: 22 U/L (ref 5–34)
Anion Gap: 8 mEq/L (ref 3–11)
BUN: 10.5 mg/dL (ref 7.0–26.0)
CHLORIDE: 105 meq/L (ref 98–109)
CO2: 26 mEq/L (ref 22–29)
Calcium: 9.6 mg/dL (ref 8.4–10.4)
Creatinine: 1.1 mg/dL (ref 0.7–1.3)
EGFR: 80 mL/min/{1.73_m2} — ABNORMAL LOW (ref >90)
GLUCOSE: 104 mg/dL (ref 70–140)
POTASSIUM: 4.3 meq/L (ref 3.5–5.1)
SODIUM: 139 meq/L (ref 136–145)
Total Bilirubin: 0.73 mg/dL (ref 0.20–1.20)
Total Protein: 7 g/dL (ref 6.4–8.3)

## 2016-12-19 LAB — LACTATE DEHYDROGENASE: LDH: 174 U/L (ref 125–245)

## 2016-12-19 NOTE — Progress Notes (Signed)
Hematology and Oncology Follow Up Visit  [name] [id number] [birth year] 51 y.o. 12/19/2016 8:45 AM Patient, No Pcp PerNo ref. 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   Prior Therapy:  He is status post right orchiectomy done on 08/01/2014. Carboplatin adjuvant chemotherapy given at AUC of 7 first dose given on 09/22/2014. The second dose was given on 10/13/2014.   Current therapy: Observation and surveillance.  Interim History: [name] presents today for a follow-up visit by himself. Since the last visit, he reports few issues. He is reporting problems with dysphagia and food being stuck at to his distal esophagus. Despite that he still able to get nutrition and have gained weight. His performance status and activity level has not changed. He does report some bloating and right upper quadrant discomfort especially when playing golf.  He denies any lymphadenopathy or constitutional symptoms. He denied any pelvic pain or penile discharge. He is able to work and perform activities of daily living. His performance status and activity level remain about the same. He does report occasional bloating that has been chronic in nature. His dysphagia and chest wall pain has been there for last 6 months.  He does not report any headaches, blurry vision, syncope or seizures. He does not report any fevers, chills, sweats. He does not report any chest pain, palpitation or orthopnea. He does not report any cough or hemoptysis or hematemesis. He is not report any nausea, vomiting,  constipation or diarrhea. He does report erectile dysfunction. He does not report any arthralgias or myalgias. He does not report any anxiety or depression. Remaining review of systems unremarkable.    Medications: I have reviewed the patient's current medications.  Current Outpatient  Prescriptions  Medication Sig Dispense Refill  . alfuzosin (UROXATRAL) 10 MG 24 hr tablet Take 1 tablet by mouth daily.    Marland Kitchen ALPRAZolam (XANAX) 1 MG tablet Take 2 mg by mouth at bedtime.     . finasteride (PROPECIA) 1 MG tablet Take 1 mg by mouth daily.    . TRINTELLIX 20 MG TABS Take 1 tablet by mouth daily.    Marland Kitchen zolpidem (AMBIEN) 10 MG tablet Take 1 tablet by mouth at bedtime.     No current facility-administered medications for this visit.      Allergies:  Allergies  Allergen Reactions  . Penicillins Hives    Past Medical History, Surgical history, Social history, and Family History were reviewed and updated.   Physical Exam: Blood pressure 130/66, pulse 72, temperature 98.8 F (37.1 C), temperature source Oral, resp. rate 18, height 5\' 8"  (1.727 m), weight 182 lb 3.2 oz (82.6 kg), SpO2 98 %. ECOG: 0 General appearance: Alert, awake gentleman without distress. Head: Normocephalic, without obvious abnormality no oral ulcers or thrush. Neck: no adenopathy no masses or lesions. Lymph nodes: Cervical, supraclavicular, and axillary nodes normal. Heart:regular rate and rhythm, S1, S2 normal, no murmur, click, rub or gallop Lung:chest clear, no wheezing, rales, normal symmetric air entry Abdomin: soft, non-tender, without masses or organomegaly . Right upper quadrant tenderness upon palpation. No rebound or guarding. EXT:no erythema, induration, or nodules   Lab Results: Lab Results  Component Value Date   WBC 7.1 12/19/2016   HGB 16.1 12/19/2016   HCT 48.2 12/19/2016   MCV 89.3 12/19/2016   PLT 139 (L) 12/19/2016     Chemistry  Component Value Date/Time   NA 140 06/06/2016 0846   K 4.2 06/06/2016 0846   CL 102 03/26/2011 1142   CO2 25 06/06/2016 0846   BUN 11.1 06/06/2016 0846   CREATININE 1.0 06/06/2016 0846      Component Value Date/Time   CALCIUM 9.3 06/06/2016 0846   ALKPHOS 68 06/06/2016 0846   AST 18 06/06/2016 0846   ALT 21 06/06/2016 0846   BILITOT  0.55 06/06/2016 0846       EXAM: CT CHEST, ABDOMEN, AND PELVIS WITH CONTRAST  TECHNIQUE: Multidetector CT imaging of the chest, abdomen and pelvis was performed following the standard protocol during bolus administration of intravenous contrast.  CONTRAST:  121mL ISOVUE-300 IOPAMIDOL (ISOVUE-300) INJECTION 61%  COMPARISON:  11/20/2015  FINDINGS: CT CHEST FINDINGS  Cardiovascular: No acute findings.  Mediastinum/Lymph Nodes: No masses or pathologically enlarged lymph nodes identified.  Lungs/Pleura: No pulmonary infiltrate or mass identified. No suspicious pulmonary nodules. Stable 3 mm nodule in the anterior right middle lobe compared to previous studies dating back to 2016, consistent with benign etiology. No effusion present.  Musculoskeletal:  No suspicious bone lesions identified.  CT ABDOMEN AND PELVIS FINDINGS  Hepatobiliary: Stable severe hepatic steatosis. Stable benign hepatic hemangiomas, largest measuring 3.5 cm. No new or enlarging hepatic masses identified. Gallbladder is unremarkable.  Pancreas:  No mass or inflammatory changes.  Spleen:  Within normal limits in size and appearance.  Adrenals/Urinary tract: No masses or hydronephrosis. Unopacified urinary bladder is unremarkable in appearance.  Stomach/Bowel: No evidence of obstruction, inflammatory process, or abnormal fluid collections.  Vascular/Lymphatic: No pathologically enlarged lymph nodes identified. No abdominal aortic aneurysm.  Reproductive: No mass or other significant abnormality identified. Postop changes from right orchiectomy again noted.  Other:  None.  Musculoskeletal:  No suspicious bone lesions identified.  IMPRESSION: Stable exam. No evidence of metastatic disease or other acute findings.  Stable severe hepatic steatosis and benign hepatic hemangiomas.   Impression and Plan:    51 year old gentleman with the following issues:  1. Right  testicular seminoma diagnosed in March 2016 after presenting with a painless right testicle mass. He is status post right orchiectomy done on 08/01/2014. The pathology showed a T2 NX. Seminoma measuring 6 cm with lymphovascular invasion.   He is status post adjuvant carboplatin at AUC of 7 for a total of 2 cycles completed in May 2016. He is currently on observation and surveillance with routine physical examination, laboratory testing and imaging studies.    CT scan and laboratory testing from 06/06/2016 was reviewed again and did not show any evidence of malignancy or abnormalities.   The plan is to continue with active surveillance with clinical visit, laboratory every 6 months. CT scan will be done annually but we will obtain a CT scan in the immediate future given his recent symptoms of abdominal pain, bloating and dyspepsia. If his CT scan are negative for malignancy, these will be obtained annually after that.  2 Thrombocytopenia: Related to systemic chemotherapy and back close to normal range.   3. Dysphagia and bloating: I will refer him to gastroenterology for evaluation and possible endoscopy. He did have a colonoscopy in the past.  4.Follow-up: Will be in 6 months for a clinical visit and laboratory testing.   [facility], MD 7/27/20188:45 AM

## 2016-12-20 LAB — BETA HCG QUANT (REF LAB)

## 2016-12-20 LAB — AFP TUMOR MARKER: AFP, Serum, Tumor Marker: 3.9 ng/mL (ref 0.0–8.3)

## 2017-01-15 ENCOUNTER — Ambulatory Visit: Payer: BLUE CROSS/BLUE SHIELD | Admitting: Nurse Practitioner

## 2017-01-30 ENCOUNTER — Ambulatory Visit (HOSPITAL_COMMUNITY)
Admission: RE | Admit: 2017-01-30 | Discharge: 2017-01-30 | Disposition: A | Discharge status: Home / Self Care | Payer: BLUE CROSS/BLUE SHIELD | Source: Ambulatory Visit | Attending: Oncology | Admitting: Oncology

## 2017-01-30 ENCOUNTER — Encounter (HOSPITAL_COMMUNITY): Payer: Self-pay

## 2017-01-30 DIAGNOSIS — C6292 Malignant neoplasm of left testis, unspecified whether descended or undescended: Secondary | ICD-10-CM | POA: Insufficient documentation

## 2017-01-30 DIAGNOSIS — D1803 Hemangioma of intra-abdominal structures: Secondary | ICD-10-CM | POA: Diagnosis not present

## 2017-01-30 DIAGNOSIS — K760 Fatty (change of) liver, not elsewhere classified: Secondary | ICD-10-CM | POA: Diagnosis not present

## 2017-01-30 MED ORDER — IOPAMIDOL (ISOVUE-300) INJECTION 61%
100.0000 mL | Freq: Once | INTRAVENOUS | Status: AC | PRN
  Administered 2017-01-30: 100 mL via INTRAVENOUS

## 2017-01-30 MED ORDER — IOPAMIDOL (ISOVUE-300) INJECTION 61%
INTRAVENOUS | Status: AC
  Filled 2017-01-30: qty 100

## 2017-05-06 ENCOUNTER — Other Ambulatory Visit: Payer: Self-pay

## 2017-05-06 ENCOUNTER — Emergency Department (INDEPENDENT_AMBULATORY_CARE_PROVIDER_SITE_OTHER): Payer: BLUE CROSS/BLUE SHIELD

## 2017-05-06 ENCOUNTER — Emergency Department (INDEPENDENT_AMBULATORY_CARE_PROVIDER_SITE_OTHER)
Admission: EM | Admit: 2017-05-06 | Discharge: 2017-05-06 | Disposition: A | Discharge status: Home / Self Care | Payer: BLUE CROSS/BLUE SHIELD | Source: Home / Self Care | Attending: Family Medicine | Admitting: Family Medicine

## 2017-05-06 ENCOUNTER — Encounter: Payer: Self-pay | Admitting: Emergency Medicine

## 2017-05-06 DIAGNOSIS — R053 Chronic cough: Secondary | ICD-10-CM

## 2017-05-06 DIAGNOSIS — Z8547 Personal history of malignant neoplasm of testis: Secondary | ICD-10-CM

## 2017-05-06 DIAGNOSIS — R05 Cough: Secondary | ICD-10-CM

## 2017-05-06 MED ORDER — GUAIFENESIN-CODEINE 100-10 MG/5ML PO SOLN: ORAL | 0 refills | Status: DC

## 2017-05-06 MED ORDER — PREDNISONE 20 MG PO TABS: ORAL_TABLET | ORAL | 0 refills | Status: DC

## 2017-05-06 MED ORDER — DOXYCYCLINE HYCLATE 100 MG PO CAPS: 100.0000 mg | ORAL_CAPSULE | Freq: Two times a day (BID) | ORAL | 0 refills | Status: DC

## 2017-05-06 NOTE — ED Triage Notes (Signed)
Cough x 10 days post flu.

## 2017-05-06 NOTE — ED Provider Notes (Signed)
[name]    CSN: [id number] Arrival date & time: 05/06/17  1243     History   Chief Complaint Chief Complaint  Patient presents with  . Cough    HPI [name] is a 51 y.o. male.   Patient had an influenza-like illness treated with Tamiflu.  He improved, but his cough never completely resolved.  About 10 days ago his cough became worse, but he did not have fever.  His cough has become increasingly non-productive, and he coughs until he gags.  During the past two days while shoveling snow, he has become easily winded and feels tight in his anterior chest.    He has a history of testicular cancer, and had a negative CT scan chest in September 2018.                                    The history is provided by the patient.    Past Medical History:  Diagnosis Date  . Headache    tension h/a's  . Heart murmur   . Sleep apnea    no cpap, unable to tolerate mask  . testicular ca dx'd 07/2014   right  . Testicular mass    rt sided  . Weakness    arms and legs  . Wears glasses    for computer work    Patient Active Problem List   Diagnosis Date Noted  . Bowel habit changes 01/31/2015  . Bloating 01/31/2015  . Testis cancer ([place]) 09/15/2014    Past Surgical History:  Procedure Laterality Date  . CERVICAL SPINE SURGERY     w/ plates and screws  . ORCHIECTOMY Right 08/01/2014   Procedure: RIGHT RADICAL ORCHIECTOMY WITH PROSTESISPLACEMENT;  Surgeon: Irine Seal, MD;  Location: [facility];  Service: Urology;  Laterality: Right;  . SHOULDER ARTHROSCOPY W/ ACROMIAL REPAIR Bilateral        Home Medications    Prior to Admission medications   Medication Sig Start Date End Date Taking? Authorizing Provider  alfuzosin (UROXATRAL) 10 MG 24 hr tablet Take 1 tablet by mouth daily. 06/05/16   [provider]  ALPRAZolam Duanne Moron) 1 MG tablet Take 2 mg by mouth at bedtime.     [provider]  doxycycline (VIBRAMYCIN) 100 MG  capsule Take 1 capsule (100 mg total) by mouth 2 (two) times daily. Take with food. 05/06/17   Kandra Nicolas, MD  finasteride (PROPECIA) 1 MG tablet Take 1 mg by mouth daily.    [provider]  guaiFENesin-codeine 100-10 MG/5ML syrup Take 9mL by mouth at bedtime as needed for cough.  May repeat dose in 4 to 6 hours. 05/06/17   Kandra Nicolas, MD  predniSONE (DELTASONE) 20 MG tablet Take one tab by mouth twice daily for 4 days, then one daily for 3 days. Take with food. 05/06/17   Kandra Nicolas, MD  TRINTELLIX 20 MG TABS Take 1 tablet by mouth daily. 06/03/16   [provider]  zolpidem (AMBIEN) 10 MG tablet Take 1 tablet by mouth at bedtime. 11/29/15   [provider]    Family History Family History  Problem Relation Age of Onset  . Heart disease Father     Social History Social History   Tobacco Use  . Smoking status: Current Some Day Smoker  . Smokeless tobacco: Never Used  Substance Use Topics  . Alcohol  use: Yes    Alcohol/week: 7.2 oz    Types: 12 Cans of beer per week  . Drug use: No     Allergies   Penicillins   Review of Systems Review of Systems No sore throat + cough No pleuritic pain, but feels burning sensation over sternum No wheezing No nasal congestion No post-nasal drainage No sinus pain/pressure No itchy/red eyes No earache No hemoptysis + SOB with activity No fever/chills No nausea No vomiting No abdominal pain No diarrhea No urinary symptoms No skin rash + fatigue No myalgias No headache Used OTC meds without relief   Physical Exam Triage Vital Signs ED Triage Vitals  Enc Vitals Group     BP 05/06/17 1300 (!) 155/86     Pulse Rate 05/06/17 1300 67     Resp --      Temp 05/06/17 1300 97.7 F (36.5 C)     Temp Source 05/06/17 1300 Oral     SpO2 05/06/17 1300 99 %     Weight 05/06/17 1301 176 lb (79.8 kg)     Height 05/06/17 1301 5\' 7"  (1.702 m)     Head Circumference --      Peak Flow --       Pain Score 05/06/17 1301 5     Pain Loc --      Pain Edu? --      Excl. in [place]? --    No data found.  Updated Vital Signs BP (!) 155/86 (BP Location: Right Arm)   Pulse 67   Temp 97.7 F (36.5 C) (Oral)   Ht 5\' 7"  (1.702 m)   Wt 176 lb (79.8 kg)   SpO2 99%   BMI 27.57 kg/m   Visual Acuity Right Eye Distance:   Left Eye Distance:   Bilateral Distance:    Right Eye Near:   Left Eye Near:    Bilateral Near:     Physical Exam Nursing notes and Vital Signs reviewed. Appearance:  Patient appears stated age, and in no acute distress Eyes:  Pupils are equal, round, and reactive to light and accomodation.  Extraocular movement is intact.  Conjunctivae are not inflamed  Ears:  Canals normal.  Tympanic membranes normal.  Nose:  Mildly congested turbinates.  No sinus tenderness.  Pharynx:  Normal Neck:  Supple.  Enlarged posterior/lateral nodes are palpated bilaterally, tender to palpation on the left.   Lungs:  Clear to auscultation.  Breath sounds are equal.  Moving air well. Chest:   No tenderness to palpation over the sternum.  Heart:  Regular rate and rhythm without murmurs, rubs, or gallops.  Abdomen:  Nontender without masses or hepatosplenomegaly.  Bowel sounds are present.  No CVA or flank tenderness.  Extremities:  No edema.  Skin:  No rash present.    UC Treatments / Results  Labs (all labs ordered are listed, but only abnormal results are displayed) Labs Reviewed  BORDETELLA PERTUSSIS PCR  OTHER LAB TEST    EKG  EKG Interpretation None       Radiology Dg Chest 2 View  Result Date: 05/06/2017 CLINICAL DATA:  Cough for 10 days.  History of testicular carcinoma EXAM: CHEST  2 VIEW COMPARISON:  Chest radiograph August 01, 2014 and chest CT January 30, 2017 FINDINGS: Lungs are clear. Heart size and pulmonary vascularity are normal. No adenopathy. Postoperative changes noted in the lower cervical spine region. IMPRESSION: No edema or consolidation. Electronically  Signed   By: Lowella Grip III M.D.  On: 05/06/2017 13:15    Procedures Procedures (including critical care time)  Medications Ordered in UC Medications - No data to display   Initial Impression / Assessment and Plan / UC Course  I have reviewed the triage vital signs and the nursing notes.  Pertinent labs & imaging results that were available during my care of the patient were reviewed by me and considered in my medical decision making (see chart for details).    Begin doxycycline 100mg  BID for atypical coverage. Check pertussis antibodies. Begin prednisone burst/taper. Rx for Robitussin AC for night time cough. Controlled Substance Prescriptions I have consulted the  Controlled Substances Registry for this patient, and feel the risk/benefit ratio today is favorable for proceeding with this prescription for a controlled substance.   Take plain guaifenesin (1200mg  extended release tabs such as Mucinex) twice daily, with plenty of water, for cough and congestion.   Stop all antihistamines for now, and other non-prescription cough/cold preparations. Followup with Family Doctor if not improved in 10 days.    Final Clinical Impressions(s) / UC Diagnoses   Final diagnoses:  Persistent cough for 3 weeks or longer    ED Discharge Orders        Ordered    guaiFENesin-codeine 100-10 MG/5ML syrup     05/06/17 1354    doxycycline (VIBRAMYCIN) 100 MG capsule  2 times daily     05/06/17 1354    predniSONE (DELTASONE) 20 MG tablet     05/06/17 1354           Kandra Nicolas, MD 05/06/17 1705

## 2017-05-06 NOTE — Discharge Instructions (Signed)
Take plain guaifenesin (1200mg  extended release tabs such as Mucinex) twice daily, with plenty of water, for cough and congestion.   Stop all antihistamines for now, and other non-prescription cough/cold preparations.

## 2017-05-08 LAB — BORDETELLA PERTUSSIS PCR
B. PERTUSSIS DNA: NOT DETECTED
B. parapertussis DNA: NOT DETECTED

## 2017-05-10 ENCOUNTER — Telehealth: Payer: Self-pay | Admitting: Emergency Medicine

## 2017-05-10 NOTE — Telephone Encounter (Signed)
Patient informed that his Bordetella Pertussis test was negative.  Patient states that he is feeling better.

## 2017-05-13 ENCOUNTER — Telehealth: Payer: Self-pay | Admitting: *Deleted

## 2017-05-13 NOTE — Telephone Encounter (Signed)
Called [facility] to check the status of patients results was informed that the Pertussis Cx is still pending and has a 7-10 day turn around time.

## 2017-05-15 LAB — CULTURE, BORDETELLA PERTUSSIS

## 2017-05-16 ENCOUNTER — Telehealth: Payer: Self-pay | Admitting: Emergency Medicine

## 2017-06-19 ENCOUNTER — Inpatient Hospital Stay: Payer: BLUE CROSS/BLUE SHIELD | Attending: Oncology | Admitting: Oncology

## 2017-06-19 ENCOUNTER — Telehealth: Payer: Self-pay | Admitting: Oncology

## 2017-06-19 ENCOUNTER — Inpatient Hospital Stay: Payer: BLUE CROSS/BLUE SHIELD

## 2017-06-19 VITALS — BP 112/57 | HR 85 | Temp 98.6°F | Resp 18 | Ht 67.0 in | Wt 173.2 lb

## 2017-06-19 DIAGNOSIS — C6292 Malignant neoplasm of left testis, unspecified whether descended or undescended: Secondary | ICD-10-CM

## 2017-06-19 DIAGNOSIS — D6959 Other secondary thrombocytopenia: Secondary | ICD-10-CM | POA: Diagnosis not present

## 2017-06-19 DIAGNOSIS — Z8547 Personal history of malignant neoplasm of testis: Secondary | ICD-10-CM | POA: Insufficient documentation

## 2017-06-19 DIAGNOSIS — Z9221 Personal history of antineoplastic chemotherapy: Secondary | ICD-10-CM | POA: Insufficient documentation

## 2017-06-19 DIAGNOSIS — Z9079 Acquired absence of other genital organ(s): Secondary | ICD-10-CM | POA: Insufficient documentation

## 2017-06-19 LAB — CBC WITH DIFFERENTIAL/PLATELET
Basophils Absolute: 0.1 10*3/uL (ref 0.0–0.1)
Basophils Relative: 1 %
EOS ABS: 0.2 10*3/uL (ref 0.0–0.5)
Eosinophils Relative: 2 %
HCT: 48.9 % (ref 38.4–49.9)
Hemoglobin: 16.2 g/dL (ref 13.0–17.1)
Lymphocytes Relative: 25 %
Lymphs Abs: 1.6 10*3/uL (ref 0.9–3.3)
MCH: 29.3 pg (ref 27.2–33.4)
MCHC: 33.2 g/dL (ref 32.0–36.0)
MCV: 88.3 fL (ref 79.3–98.0)
MONOS PCT: 10 %
Monocytes Absolute: 0.7 10*3/uL (ref 0.1–0.9)
NEUTROS ABS: 4.1 10*3/uL (ref 1.5–6.5)
NEUTROS PCT: 62 %
Platelets: 130 10*3/uL — ABNORMAL LOW (ref 140–400)
RBC: 5.53 MIL/uL (ref 4.20–5.82)
RDW: 13.8 % (ref 11.0–15.6)
WBC: 6.7 10*3/uL (ref 4.0–10.3)

## 2017-06-19 LAB — COMPREHENSIVE METABOLIC PANEL
ALK PHOS: 57 U/L (ref 40–150)
ALT: 24 U/L (ref 0–55)
ANION GAP: 10 (ref 3–11)
AST: 20 U/L (ref 5–34)
Albumin: 4.2 g/dL (ref 3.5–5.0)
BILIRUBIN TOTAL: 0.5 mg/dL (ref 0.2–1.2)
BUN: 12 mg/dL (ref 7–26)
CALCIUM: 10 mg/dL (ref 8.4–10.4)
CO2: 26 mmol/L (ref 22–29)
CREATININE: 1.24 mg/dL (ref 0.70–1.30)
Chloride: 105 mmol/L (ref 98–109)
Glucose, Bld: 101 mg/dL (ref 70–140)
Potassium: 4.7 mmol/L (ref 3.5–5.1)
SODIUM: 141 mmol/L (ref 136–145)
Total Protein: 7.2 g/dL (ref 6.4–8.3)

## 2017-06-19 LAB — LACTATE DEHYDROGENASE: LDH: 154 U/L (ref 125–245)

## 2017-06-19 NOTE — Telephone Encounter (Signed)
Gave avs and calendar for July and august

## 2017-06-19 NOTE — Progress Notes (Signed)
Hematology and Oncology Follow Up Visit  [name] [id number] [birth year] 52 y.o. 06/19/2017 9:03 AM Patient, No Pcp PerNo ref. 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   Prior Therapy:  He is status post right orchiectomy done on 08/01/2014. 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  Carboplatin adjuvant chemotherapy given at AUC of 7 first dose given on 09/22/2014. The second dose was given on 10/13/2014.   Current therapy: Observation and surveillance.  Interim History: [name] is here for a follow-up.  He reported upper respiratory tract infection and possible flu diagnosed in December 2018.  At that time he was prescribed doxycycline and prednisone as well as guaifenesin with codeine.  He completed prednisone for 3 days as well as doxycycline for a period of time.  His symptoms resolved and have fully recovered at this time.  He is no longer reporting any shortness of breath, cough or muscle aches.  He is exercising more regularly now and intentionally losing weight.  He lost 10 pounds since December 2018.  He denies any lymphadenopathy or masses.  He denies any penile discharge.  He does report nocturia and urgency at times.  He does not report any headaches, blurry vision, syncope or seizures. He does not report any fevers, chills, sweats. He does not report any chest pain, palpitation or orthopnea. He does not report any cough or hemoptysis or hematemesis. He is not report any nausea, vomiting, constipation or diarrhea.  He denies any hematuria, dysuria.  He does not report any arthralgias or myalgias.  He does not report any heat or cold intolerance.  He does not report any lymphadenopathy or petechiae. Remaining review of systems is negative.   Medications: I have reviewed the patient's current medications.  Current Outpatient Medications  Medication Sig  Dispense Refill  . alfuzosin (UROXATRAL) 10 MG 24 hr tablet Take 1 tablet by mouth daily.    Marland Kitchen ALPRAZolam (XANAX) 1 MG tablet Take 2 mg by mouth at bedtime.     Marland Kitchen doxycycline (VIBRAMYCIN) 100 MG capsule Take 1 capsule (100 mg total) by mouth 2 (two) times daily. Take with food. 20 capsule 0  . finasteride (PROPECIA) 1 MG tablet Take 1 mg by mouth daily.    Marland Kitchen guaiFENesin-codeine 100-10 MG/5ML syrup Take 18mL by mouth at bedtime as needed for cough.  May repeat dose in 4 to 6 hours. 100 mL 0  . predniSONE (DELTASONE) 20 MG tablet Take one tab by mouth twice daily for 4 days, then one daily for 3 days. Take with food. 11 tablet 0  . TRINTELLIX 20 MG TABS Take 1 tablet by mouth daily.    Marland Kitchen zolpidem (AMBIEN) 10 MG tablet Take 1 tablet by mouth at bedtime.     No current facility-administered medications for this visit.      Allergies:  Allergies  Allergen Reactions  . Penicillins Hives    Past Medical History, Surgical history, Social history, and Family History were reviewed and updated.   Physical Exam: Blood pressure (!) 112/57, pulse 85, temperature 98.6 F (37 C), temperature source Oral, resp. rate 18, height 5\' 7"  (1.702 m), weight 173 lb 3.2 oz (78.6 kg), SpO2 98 %. ECOG: 0 General appearance: Well-appearing gentleman appeared without distress. Head: Normocephalic, without obvious abnormality  Oropharynx: No thrush or ulcers. Eyes: No scleral icterus. Lymph nodes: Cervical, supraclavicular, and axillary nodes normal. Heart:regular rate  and rhythm, S1, S2 normal, no murmur, click, rub or gallop Lung:clear in all lung fields., no wheezing, rales, normal symmetric air entry Abdomin: Good bowel sounds in all 4 quadrants.  No rebound or guarding.  No shifting dullness or ascites. Musculoskeletal: No joint deformity or effusion.  He has full range of motion in all joints. Skin: No rashes or lesions.   Lab Results: Lab Results  Component Value Date   WBC 7.1 12/19/2016   HGB  16.1 12/19/2016   HCT 48.2 12/19/2016   MCV 89.3 12/19/2016   PLT 139 (L) 12/19/2016     Chemistry      Component Value Date/Time   NA 139 12/19/2016 0814   K 4.3 12/19/2016 0814   CL 102 03/26/2011 1142   CO2 26 12/19/2016 0814   BUN 10.5 12/19/2016 0814   CREATININE 1.1 12/19/2016 0814      Component Value Date/Time   CALCIUM 9.6 12/19/2016 0814   ALKPHOS 71 12/19/2016 0814   AST 22 12/19/2016 0814   ALT 26 12/19/2016 0814   BILITOT 0.73 12/19/2016 0814       Impression and Plan:    52 year old gentleman with the following issues:  1.  Pure seminoma of the right testicle.  This was diagnosed in March 2016 after presenting with a painless right testicle mass.   He is status post right orchiectomy done on 08/01/2014. The pathology showed a T2 NX. Seminoma measuring 6 cm with lymphovascular invasion.   He completed adjuvant carboplatin chemotherapy for 2 cycles with AUC of 7 in May 2016.  He is currently on active surveillance without any evidence of recurrent disease.  CT scan obtained in September 2018 was reviewed again and discussed with the patient and showed no evidence of recurrent disease.  His tumor markers are all within normal range.  The natural course of this disease was reviewed again as well as the surveillance protocol was discussed.  We will continue follow-up every 6 months to complete 5 years and he will have CT scan annually to complete 5 years.  After that, he will have visits for clinical exam and laboratory testing annually only.  His next follow-up and a CT scan will be in July 2019.  2 Thrombocytopenia: Very mild and not changed from previous examination.  No active bleeding noted.  Etiology is related to previous chemotherapy exposure.  We will continue to monitor moving forward.  3.Follow-up: Will be in 6 months for a clinical visit and laboratory testing and CT scan.  15  minutes was spent with the patient face-to-face today.  More than 50% of  time was dedicated to patient counseling, education and answering questioning regarding his long-term prognosis and surveillance schedule.   Zola Button, MD 1/25/20199:03 AM

## 2017-06-20 LAB — AFP TUMOR MARKER: AFP, SERUM, TUMOR MARKER: 4.5 ng/mL (ref 0.0–8.3)

## 2017-06-20 LAB — BETA HCG QUANT (REF LAB)

## 2017-12-17 ENCOUNTER — Ambulatory Visit (HOSPITAL_COMMUNITY): Admission: RE | Admit: 2017-12-17 | Payer: BLUE CROSS/BLUE SHIELD | Source: Ambulatory Visit

## 2017-12-17 ENCOUNTER — Other Ambulatory Visit: Payer: BLUE CROSS/BLUE SHIELD

## 2017-12-22 ENCOUNTER — Telehealth: Payer: Self-pay | Admitting: Oncology

## 2017-12-22 NOTE — Telephone Encounter (Signed)
Patient left voice mail.  Called patient back.  Patient originally had a CT scan on July 25th but could not complete it due to illness from the prep.  CT was rescheduled for 8/6.  Therefore, needs to change 8/1 f/u dr appt to after 8/6 CT appt.  Rescheduled for 8/9.

## 2017-12-24 ENCOUNTER — Ambulatory Visit: Payer: BLUE CROSS/BLUE SHIELD | Admitting: Oncology

## 2017-12-29 ENCOUNTER — Ambulatory Visit (HOSPITAL_COMMUNITY)
Admission: RE | Admit: 2017-12-29 | Discharge: 2017-12-29 | Disposition: A | Discharge status: Home / Self Care | Payer: BLUE CROSS/BLUE SHIELD | Source: Ambulatory Visit | Attending: Oncology | Admitting: Oncology

## 2017-12-29 ENCOUNTER — Inpatient Hospital Stay: Payer: BLUE CROSS/BLUE SHIELD | Attending: Oncology

## 2017-12-29 DIAGNOSIS — K760 Fatty (change of) liver, not elsewhere classified: Secondary | ICD-10-CM | POA: Diagnosis not present

## 2017-12-29 DIAGNOSIS — C6292 Malignant neoplasm of left testis, unspecified whether descended or undescended: Secondary | ICD-10-CM

## 2017-12-29 DIAGNOSIS — Z79899 Other long term (current) drug therapy: Secondary | ICD-10-CM | POA: Insufficient documentation

## 2017-12-29 DIAGNOSIS — Z8547 Personal history of malignant neoplasm of testis: Secondary | ICD-10-CM | POA: Diagnosis present

## 2017-12-29 DIAGNOSIS — D696 Thrombocytopenia, unspecified: Secondary | ICD-10-CM | POA: Diagnosis not present

## 2017-12-29 DIAGNOSIS — Z9221 Personal history of antineoplastic chemotherapy: Secondary | ICD-10-CM | POA: Insufficient documentation

## 2017-12-29 LAB — CBC WITH DIFFERENTIAL (CANCER CENTER ONLY)
BASOS PCT: 1 %
Basophils Absolute: 0 10*3/uL (ref 0.0–0.1)
Eosinophils Absolute: 0.2 10*3/uL (ref 0.0–0.5)
Eosinophils Relative: 3 %
HEMATOCRIT: 48.8 % (ref 38.4–49.9)
HEMOGLOBIN: 16.2 g/dL (ref 13.0–17.1)
LYMPHS ABS: 2.3 10*3/uL (ref 0.9–3.3)
Lymphocytes Relative: 33 %
MCH: 29.8 pg (ref 27.2–33.4)
MCHC: 33.2 g/dL (ref 32.0–36.0)
MCV: 89.9 fL (ref 79.3–98.0)
MONOS PCT: 9 %
Monocytes Absolute: 0.6 10*3/uL (ref 0.1–0.9)
NEUTROS ABS: 3.7 10*3/uL (ref 1.5–6.5)
NEUTROS PCT: 54 %
Platelet Count: 127 10*3/uL — ABNORMAL LOW (ref 140–400)
RBC: 5.43 MIL/uL (ref 4.20–5.82)
RDW: 13.3 % (ref 11.0–14.6)
WBC Count: 6.8 10*3/uL (ref 4.0–10.3)

## 2017-12-29 LAB — CMP (CANCER CENTER ONLY)
ALT: 32 U/L (ref 0–44)
ANION GAP: 10 (ref 5–15)
AST: 24 U/L (ref 15–41)
Albumin: 4.1 g/dL (ref 3.5–5.0)
Alkaline Phosphatase: 68 U/L (ref 38–126)
BUN: 10 mg/dL (ref 6–20)
CHLORIDE: 104 mmol/L (ref 98–111)
CO2: 27 mmol/L (ref 22–32)
Calcium: 9.4 mg/dL (ref 8.9–10.3)
Creatinine: 1.17 mg/dL (ref 0.61–1.24)
GFR, Estimated: >60 mL/min (ref >60)
Glucose, Bld: 97 mg/dL (ref 70–99)
Potassium: 4.5 mmol/L (ref 3.5–5.1)
SODIUM: 141 mmol/L (ref 135–145)
Total Bilirubin: 0.5 mg/dL (ref 0.3–1.2)
Total Protein: 7.2 g/dL (ref 6.5–8.1)

## 2017-12-29 LAB — LACTATE DEHYDROGENASE: LDH: 146 U/L (ref 98–192)

## 2017-12-29 MED ORDER — IOHEXOL 300 MG/ML  SOLN
100.0000 mL | Freq: Once | INTRAMUSCULAR | Status: AC | PRN
  Administered 2017-12-29: 100 mL via INTRAVENOUS

## 2017-12-30 LAB — AFP TUMOR MARKER: AFP, Serum, Tumor Marker: 3.3 ng/mL (ref 0.0–8.3)

## 2017-12-30 LAB — BETA HCG QUANT (REF LAB)

## 2018-01-01 ENCOUNTER — Inpatient Hospital Stay: Payer: BLUE CROSS/BLUE SHIELD | Admitting: Oncology

## 2018-01-01 ENCOUNTER — Telehealth: Payer: Self-pay | Admitting: Oncology

## 2018-01-01 VITALS — BP 140/67 | HR 61 | Temp 98.4°F | Resp 17 | Ht 67.0 in | Wt 175.6 lb

## 2018-01-01 DIAGNOSIS — Z79899 Other long term (current) drug therapy: Secondary | ICD-10-CM

## 2018-01-01 DIAGNOSIS — Z8547 Personal history of malignant neoplasm of testis: Secondary | ICD-10-CM | POA: Diagnosis not present

## 2018-01-01 DIAGNOSIS — Z9221 Personal history of antineoplastic chemotherapy: Secondary | ICD-10-CM | POA: Diagnosis not present

## 2018-01-01 DIAGNOSIS — D696 Thrombocytopenia, unspecified: Secondary | ICD-10-CM

## 2018-01-01 DIAGNOSIS — C6292 Malignant neoplasm of left testis, unspecified whether descended or undescended: Secondary | ICD-10-CM

## 2018-01-01 NOTE — Progress Notes (Signed)
Hematology and Oncology Follow Up Visit  [name] [id number] [birth year] 52 y.o. 01/01/2018 1:43 PM Patient, No Pcp PerNo ref. provider found   Principle Diagnosis: 52 year old man with right testicular seminoma diagnosed in 2016.  He was found to have T2N0 tumor.     Prior Therapy:  He is status post right orchiectomy done on 08/01/2014. 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  Carboplatin adjuvant chemotherapy given at AUC of 7 first dose given on 09/22/2014. The second dose was given on 10/13/2014.   Current therapy: Active surveillance.  Interim History: Mr. [name] presents today for a follow-up.  Since the last visit, he reports no major changes in his health.  He continues to be active and works full-time.  He denies any recent hospitalizations or illnesses.  He denies any penile discharge or testicular discomfort.  He did report some abdominal distention and diarrhea after drinking contrast for CT scan.  These symptoms has resolved at this time.  He does not report any headaches, blurry vision, syncope or seizures.  He denies any alteration mental status or confusion.  He does not report any fevers, chills, sweats. He does not report any chest pain, palpitation or orthopnea. He does not report any cough or hemoptysis or hematemesis. He is not report any nausea, vomiting.  He denies any changes in his bowel habits.  He does not report any bone pain or pathological fractures.  He does not report any bleeding or clotting tendencies.  He does not report any lymphadenopathy or petechiae. Remaining review of systems is negative.   Medications: I have reviewed the patient's current medications.  Current Outpatient Medications  Medication Sig Dispense Refill  . alfuzosin (UROXATRAL) 10 MG 24 hr tablet Take 1 tablet by mouth daily.    Marland Kitchen ALPRAZolam (XANAX) 1 MG tablet Take 2 mg by mouth at bedtime.     Marland Kitchen doxycycline (VIBRAMYCIN) 100 MG capsule Take 1 capsule (100  mg total) by mouth 2 (two) times daily. Take with food. 20 capsule 0  . finasteride (PROPECIA) 1 MG tablet Take 1 mg by mouth daily.    Marland Kitchen guaiFENesin-codeine 100-10 MG/5ML syrup Take 63mL by mouth at bedtime as needed for cough.  May repeat dose in 4 to 6 hours. 100 mL 0  . predniSONE (DELTASONE) 20 MG tablet Take one tab by mouth twice daily for 4 days, then one daily for 3 days. Take with food. 11 tablet 0  . TRINTELLIX 20 MG TABS Take 1 tablet by mouth daily.    Marland Kitchen zolpidem (AMBIEN) 10 MG tablet Take 1 tablet by mouth at bedtime.     No current facility-administered medications for this visit.      Allergies:  Allergies  Allergen Reactions  . Penicillins Hives    Past Medical History, Surgical history, Social history, and Family History were reviewed and updated.   Physical Exam: Blood pressure 140/67, pulse 61, temperature 98.4 F (36.9 C), temperature source Oral, resp. rate 17, height 5\' 7"  (1.702 m), weight 175 lb 9.6 oz (79.7 kg), SpO2 100 %.   ECOG: 0 General appearance: Alert, awake gentleman without distress. Head: Atraumatic without abnormalities. Oropharynx: Oral thrush or ulcers. Eyes: Sclera anicteric. Lymph nodes: No lymphadenopathy noted in the cervical, supraclavicular, or axillary lymph nodes. Heart:regular rate and rhythm, without any murmurs or gallops. Lung:clear any rhonchi, wheezes or dullness to percussion. Abdomin: Soft, nontender without any rebound or guarding. Musculoskeletal: No clubbing or cyanosis. Skin: No ecchymosis  or petechiae.   Lab Results: Lab Results  Component Value Date   WBC 6.8 12/29/2017   HGB 16.2 12/29/2017   HCT 48.8 12/29/2017   MCV 89.9 12/29/2017   PLT 127 (L) 12/29/2017     Chemistry      Component Value Date/Time   NA 141 12/29/2017 0900   NA 139 12/19/2016 0814   K 4.5 12/29/2017 0900   K 4.3 12/19/2016 0814   CL 104 12/29/2017 0900   CO2 27 12/29/2017 0900   CO2 26 12/19/2016 0814   BUN 10 12/29/2017 0900    BUN 10.5 12/19/2016 0814   CREATININE 1.17 12/29/2017 0900   CREATININE 1.1 12/19/2016 0814      Component Value Date/Time   CALCIUM 9.4 12/29/2017 0900   CALCIUM 9.6 12/19/2016 0814   ALKPHOS 68 12/29/2017 0900   ALKPHOS 71 12/19/2016 0814   AST 24 12/29/2017 0900   AST 22 12/19/2016 0814   ALT 32 12/29/2017 0900   ALT 26 12/19/2016 0814   BILITOT 0.5 12/29/2017 0900   BILITOT 0.73 12/19/2016 0814     EXAM: CT CHEST, ABDOMEN, AND PELVIS WITH CONTRAST  TECHNIQUE: Multidetector CT imaging of the chest, abdomen and pelvis was performed following the standard protocol during bolus administration of intravenous contrast.  CONTRAST:  156mL OMNIPAQUE IOHEXOL 300 MG/ML  SOLN  COMPARISON:  01/30/2017  FINDINGS: CT CHEST FINDINGS  Cardiovascular: No acute findings.  Mediastinum/Lymph Nodes: No masses or pathologically enlarged lymph nodes identified.  Lungs/Pleura: 3 mm pulmonary nodule in the right middle lobe on image 73/6 remains stable. No new or enlarging nodules or masses identified. No evidence of pulmonary infiltrate or pleural effusion.  Musculoskeletal:  No suspicious bone lesions identified.  CT ABDOMEN AND PELVIS FINDINGS  Hepatobiliary: Severe hepatic steatosis is again demonstrated. A 4 cm benign hemangioma is again seen in the central liver at the junction of the right and left lobes. No new or enlarging liver lesions are identified. Gallbladder is unremarkable.  Pancreas:  No mass or inflammatory changes.  Spleen:  Within normal limits in size and appearance.  Adrenals/Urinary tract: 2 mm nonobstructing calculus seen in lower pole of left kidney. No masses or hydronephrosis.  Stomach/Bowel: No evidence of obstruction, inflammatory process, or abnormal fluid collections.  Vascular/Lymphatic: No pathologically enlarged lymph nodes identified. No abdominal aortic aneurysm. Aortic atherosclerosis.  Reproductive: No mass or other  significant abnormality identified. Prior right orchiectomy and testicular prosthesis noted.  Other:  None.  Musculoskeletal:  No suspicious bone lesions identified.  IMPRESSION: Stable exam.  No evidence of metastatic disease.  Severe hepatic steatosis and benign hemangioma.    Impression and Plan:    52 year old man with:  1.  T2N0 right testicular seminoma diagnosed in 2016.  He received radical orchiectomy and followed by adjuvant carboplatin chemotherapy.  He remains on active surveillance at this time.  His laboratory data and CT scan obtained on 12/29/2017 were personally reviewed and showed no evidence of recurrent disease.  The natural course of this disease as well as risk of relapse and treatment options in the future were reviewed.  At this time, I have recommended continue active surveillance with repeat laboratory testing and physical exam in 6 months and the CT scan in 1 year.  No further imaging studies will be needed after that.  He is agreeable with this plan.  2 Thrombocytopenia: Mild and asymptomatic at this time.  Could be related complications from chemotherapy and no intervention is needed.  3.Follow-up:  Will be in 6 months for repeat laboratory testing and physical exam.  15  minutes was spent with the patient face-to-face today.  More than 50% of time was dedicated to discussing the natural course of this disease, reviewing imaging studies and coordinating his future plan of care.   Zola Button, MD 8/9/20191:43 PM

## 2018-01-01 NOTE — Telephone Encounter (Signed)
Appointments scheduled AVS/Calendar declined per 8/9 los

## 2018-07-06 ENCOUNTER — Telehealth: Payer: Self-pay

## 2018-07-06 ENCOUNTER — Inpatient Hospital Stay (HOSPITAL_BASED_OUTPATIENT_CLINIC_OR_DEPARTMENT_OTHER): Payer: BLUE CROSS/BLUE SHIELD | Admitting: Oncology

## 2018-07-06 ENCOUNTER — Inpatient Hospital Stay: Payer: BLUE CROSS/BLUE SHIELD | Attending: Oncology

## 2018-07-06 VITALS — BP 123/70 | HR 73 | Temp 98.6°F | Resp 17 | Ht 67.0 in | Wt 169.0 lb

## 2018-07-06 DIAGNOSIS — D6959 Other secondary thrombocytopenia: Secondary | ICD-10-CM

## 2018-07-06 DIAGNOSIS — Z8547 Personal history of malignant neoplasm of testis: Secondary | ICD-10-CM | POA: Diagnosis present

## 2018-07-06 DIAGNOSIS — C6292 Malignant neoplasm of left testis, unspecified whether descended or undescended: Secondary | ICD-10-CM

## 2018-07-06 DIAGNOSIS — Z79899 Other long term (current) drug therapy: Secondary | ICD-10-CM | POA: Insufficient documentation

## 2018-07-06 LAB — CBC WITH DIFFERENTIAL (CANCER CENTER ONLY)
Abs Immature Granulocytes: 0.02 10*3/uL (ref 0.00–0.07)
BASOS PCT: 1 %
Basophils Absolute: 0.1 10*3/uL (ref 0.0–0.1)
EOS PCT: 2 %
Eosinophils Absolute: 0.1 10*3/uL (ref 0.0–0.5)
HCT: 46 % (ref 39.0–52.0)
HEMOGLOBIN: 15.4 g/dL (ref 13.0–17.0)
Immature Granulocytes: 0 %
Lymphocytes Relative: 18 %
Lymphs Abs: 1.6 10*3/uL (ref 0.7–4.0)
MCH: 29.5 pg (ref 26.0–34.0)
MCHC: 33.5 g/dL (ref 30.0–36.0)
MCV: 88.1 fL (ref 80.0–100.0)
MONO ABS: 0.8 10*3/uL (ref 0.1–1.0)
MONOS PCT: 9 %
Neutro Abs: 6.5 10*3/uL (ref 1.7–7.7)
Neutrophils Relative %: 70 %
Platelet Count: 141 10*3/uL — ABNORMAL LOW (ref 150–400)
RBC: 5.22 MIL/uL (ref 4.22–5.81)
RDW: 12.7 % (ref 11.5–15.5)
WBC: 9.1 10*3/uL (ref 4.0–10.5)
nRBC: 0 % (ref 0.0–0.2)

## 2018-07-06 LAB — CMP (CANCER CENTER ONLY)
ALK PHOS: 59 U/L (ref 38–126)
ALT: 36 U/L (ref 0–44)
AST: 29 U/L (ref 15–41)
Albumin: 4.4 g/dL (ref 3.5–5.0)
Anion gap: 13 (ref 5–15)
BILIRUBIN TOTAL: 1 mg/dL (ref 0.3–1.2)
BUN: 16 mg/dL (ref 6–20)
CO2: 22 mmol/L (ref 22–32)
Calcium: 9.1 mg/dL (ref 8.9–10.3)
Chloride: 106 mmol/L (ref 98–111)
Creatinine: 1.13 mg/dL (ref 0.61–1.24)
GFR, Est AFR Am: >60 mL/min (ref >60)
Glucose, Bld: 85 mg/dL (ref 70–99)
Potassium: 3.9 mmol/L (ref 3.5–5.1)
Sodium: 141 mmol/L (ref 135–145)
TOTAL PROTEIN: 7.1 g/dL (ref 6.5–8.1)

## 2018-07-06 LAB — LACTATE DEHYDROGENASE: LDH: 181 U/L (ref 98–192)

## 2018-07-06 NOTE — Progress Notes (Signed)
Hematology and Oncology Follow Up Visit  [name] [id number] [birth year] 53 y.o. 07/06/2018 8:33 AM Patient, No Pcp PerNo ref. provider found   Principle Diagnosis: 53 year old man with T2N0 right testicular seminoma diagnosed in 2016.      Prior Therapy:  He is status post right orchiectomy done on 08/01/2014. 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  Carboplatin adjuvant chemotherapy given at AUC of 7 first dose given on 09/22/2014. The second dose was given on 10/13/2014.   Current therapy: Active surveillance.  Interim History: Mr. Brobeck returns today for a repeat evaluation.  Since last visit, he reports no major changes in his health.  He continues to have issues with occasional insomnia and mild headaches but not dramatically different from previous evaluations.  He denies any abdominal discomfort or changes in his bowel habits.  He denies any hematochezia or melena.  He denies any flank pain or discomfort.  Performance status and quality of life remain excellent.  He denies any bleeding issues.  Patient denied any alteration mental status, neuropathy, confusion or dizziness.  Denies any headaches or lethargy.  Denies any night sweats, weight loss or changes in appetite.  Denied orthopnea, dyspnea on exertion or chest discomfort.  Denies shortness of breath, difficulty breathing hemoptysis or cough.  Denies any abdominal distention, nausea, early satiety or dyspepsia.  Denies any hematuria, frequency, dysuria or nocturia.  Denies any skin irritation, dryness or rash.  Denies any ecchymosis or petechiae.  Denies any lymphadenopathy or clotting.  Denies any heat or cold intolerance.  Denies any anxiety or depression.  Remaining review of system is negative.     Medications: I have reviewed the patient's current medications.  Current Outpatient Medications  Medication Sig Dispense Refill  . alfuzosin (UROXATRAL) 10 MG 24 hr tablet Take 1 tablet by mouth  daily.    Marland Kitchen ALPRAZolam (XANAX) 1 MG tablet Take 2 mg by mouth at bedtime.     Marland Kitchen doxycycline (VIBRAMYCIN) 100 MG capsule Take 1 capsule (100 mg total) by mouth 2 (two) times daily. Take with food. 20 capsule 0  . finasteride (PROPECIA) 1 MG tablet Take 1 mg by mouth daily.    Marland Kitchen guaiFENesin-codeine 100-10 MG/5ML syrup Take 43mL by mouth at bedtime as needed for cough.  May repeat dose in 4 to 6 hours. 100 mL 0  . predniSONE (DELTASONE) 20 MG tablet Take one tab by mouth twice daily for 4 days, then one daily for 3 days. Take with food. 11 tablet 0  . TRINTELLIX 20 MG TABS Take 1 tablet by mouth daily.    Marland Kitchen zolpidem (AMBIEN) 10 MG tablet Take 1 tablet by mouth at bedtime.     No current facility-administered medications for this visit.      Allergies:  Allergies  Allergen Reactions  . Penicillins Hives    Past Medical History, Surgical history, Social history, and Family History were reviewed and updated.   Physical Exam: Blood pressure 123/70, pulse 73, temperature 98.6 F (37 C), temperature source Oral, resp. rate 17, height 5\' 7"  (1.702 m), weight 169 lb (76.7 kg), SpO2 98 %.   ECOG: 0    General appearance: Comfortable appearing without any discomfort Head: Normocephalic without any trauma Oropharynx: Mucous membranes are moist and pink without any thrush or ulcers. Eyes: Pupils are equal and round reactive to light. Lymph nodes: No cervical, supraclavicular, inguinal or axillary lymphadenopathy.   Heart:regular rate and rhythm.  S1 and S2 without  leg edema. Lung: Clear without any rhonchi or wheezes.  No dullness to percussion. Abdomin: Soft, nontender, nondistended with good bowel sounds.  No hepatosplenomegaly. Musculoskeletal: No joint deformity or effusion.  Full range of motion noted. Neurological: No deficits noted on motor, sensory and deep tendon reflex exam. Skin: No petechial rash or dryness.  Appeared moist.     Lab Results: Lab Results  Component Value  Date   WBC 6.8 12/29/2017   HGB 16.2 12/29/2017   HCT 48.8 12/29/2017   MCV 89.9 12/29/2017   PLT 127 (L) 12/29/2017     Chemistry      Component Value Date/Time   NA 141 12/29/2017 0900   NA 139 12/19/2016 0814   K 4.5 12/29/2017 0900   K 4.3 12/19/2016 0814   CL 104 12/29/2017 0900   CO2 27 12/29/2017 0900   CO2 26 12/19/2016 0814   BUN 10 12/29/2017 0900   BUN 10.5 12/19/2016 0814   CREATININE 1.17 12/29/2017 0900   CREATININE 1.1 12/19/2016 0814      Component Value Date/Time   CALCIUM 9.4 12/29/2017 0900   CALCIUM 9.6 12/19/2016 0814   ALKPHOS 68 12/29/2017 0900   ALKPHOS 71 12/19/2016 0814   AST 24 12/29/2017 0900   AST 22 12/19/2016 0814   ALT 32 12/29/2017 0900   ALT 26 12/19/2016 0814   BILITOT 0.5 12/29/2017 0900   BILITOT 0.73 12/19/2016 0814      IMPRESSION: Stable exam.  No evidence of metastatic disease.  Severe hepatic steatosis and benign hemangioma.    Impression and Plan:    53 year old man with:  1.  Testicular cancer diagnosed in 2016.  He was found to have T2N0 with pure seminoma.  He remains on active surveillance after radical orchiectomy and adjuvant chemotherapy.   The natural course of this disease and risk of relapse was assessed today.  The plan is to continue with active surveillance with imaging studies on an annual basis and will be done for the last time in August 2020.  Physical examination will be done annually after that.  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  No active bleeding or bruising noted.  Platelet count remains adequate today.  3.Follow-up: In 6 months for repeat imaging studies and evaluation.  15  minutes was spent with the patient face-to-face today.  More than 50% of time was spent on reviewing his disease status, laboratory data and answering questions regarding future plan of care.   Zola Button, MD 2/11/20208:33 AM

## 2018-07-06 NOTE — Telephone Encounter (Signed)
Error opening  

## 2018-07-06 NOTE — Telephone Encounter (Signed)
Printed avs and calender of upcoming appointment. Per 2/11 los 

## 2018-07-07 LAB — BETA HCG QUANT (REF LAB)

## 2018-07-07 LAB — AFP TUMOR MARKER: AFP, Serum, Tumor Marker: 3.7 ng/mL (ref 0.0–8.3)

## 2018-12-24 ENCOUNTER — Telehealth: Payer: Self-pay

## 2018-12-24 NOTE — Telephone Encounter (Signed)
Received on-call message that patient needs more information about when to drink the contrast for his CT scan on Tuesday 8/4. Contacted patient and left a message to drink one container of contrast at 7:30 and the second container of contrast at 8:30 for his 9:30 CT scan. To call back with any other questions, concerns, or further clarification needed.

## 2018-12-28 ENCOUNTER — Ambulatory Visit (HOSPITAL_COMMUNITY)
Admission: RE | Admit: 2018-12-28 | Discharge: 2018-12-28 | Disposition: A | Discharge status: Home / Self Care | Payer: BC Managed Care – PPO | Source: Ambulatory Visit | Attending: Oncology | Admitting: Oncology

## 2018-12-28 ENCOUNTER — Encounter (HOSPITAL_COMMUNITY): Payer: Self-pay

## 2018-12-28 ENCOUNTER — Other Ambulatory Visit: Payer: Self-pay

## 2018-12-28 ENCOUNTER — Inpatient Hospital Stay: Payer: BC Managed Care – PPO | Attending: Oncology

## 2018-12-28 DIAGNOSIS — K5730 Diverticulosis of large intestine without perforation or abscess without bleeding: Secondary | ICD-10-CM | POA: Diagnosis not present

## 2018-12-28 DIAGNOSIS — I7090 Unspecified atherosclerosis: Secondary | ICD-10-CM | POA: Insufficient documentation

## 2018-12-28 DIAGNOSIS — D696 Thrombocytopenia, unspecified: Secondary | ICD-10-CM | POA: Insufficient documentation

## 2018-12-28 DIAGNOSIS — Z9079 Acquired absence of other genital organ(s): Secondary | ICD-10-CM | POA: Diagnosis not present

## 2018-12-28 DIAGNOSIS — K760 Fatty (change of) liver, not elsewhere classified: Secondary | ICD-10-CM | POA: Diagnosis not present

## 2018-12-28 DIAGNOSIS — Z79899 Other long term (current) drug therapy: Secondary | ICD-10-CM | POA: Diagnosis not present

## 2018-12-28 DIAGNOSIS — I700 Atherosclerosis of aorta: Secondary | ICD-10-CM | POA: Diagnosis not present

## 2018-12-28 DIAGNOSIS — Z9221 Personal history of antineoplastic chemotherapy: Secondary | ICD-10-CM | POA: Diagnosis not present

## 2018-12-28 DIAGNOSIS — C6290 Malignant neoplasm of unspecified testis, unspecified whether descended or undescended: Secondary | ICD-10-CM | POA: Diagnosis not present

## 2018-12-28 DIAGNOSIS — Z880 Allergy status to penicillin: Secondary | ICD-10-CM | POA: Diagnosis not present

## 2018-12-28 DIAGNOSIS — C6292 Malignant neoplasm of left testis, unspecified whether descended or undescended: Secondary | ICD-10-CM | POA: Insufficient documentation

## 2018-12-28 LAB — CBC WITH DIFFERENTIAL (CANCER CENTER ONLY)
Abs Immature Granulocytes: 0.02 10*3/uL (ref 0.00–0.07)
Basophils Absolute: 0.1 10*3/uL (ref 0.0–0.1)
Basophils Relative: 1 %
Eosinophils Absolute: 0.3 10*3/uL (ref 0.0–0.5)
Eosinophils Relative: 3 %
HCT: 48.8 % (ref 39.0–52.0)
Hemoglobin: 16.1 g/dL (ref 13.0–17.0)
Immature Granulocytes: 0 %
Lymphocytes Relative: 30 %
Lymphs Abs: 2.2 10*3/uL (ref 0.7–4.0)
MCH: 29.9 pg (ref 26.0–34.0)
MCHC: 33 g/dL (ref 30.0–36.0)
MCV: 90.7 fL (ref 80.0–100.0)
Monocytes Absolute: 0.9 10*3/uL (ref 0.1–1.0)
Monocytes Relative: 12 %
Neutro Abs: 4 10*3/uL (ref 1.7–7.7)
Neutrophils Relative %: 54 %
Platelet Count: 131 10*3/uL — ABNORMAL LOW (ref 150–400)
RBC: 5.38 MIL/uL (ref 4.22–5.81)
RDW: 13.3 % (ref 11.5–15.5)
WBC Count: 7.4 10*3/uL (ref 4.0–10.5)
nRBC: 0 % (ref 0.0–0.2)

## 2018-12-28 LAB — LACTATE DEHYDROGENASE: LDH: 139 U/L (ref 98–192)

## 2018-12-28 LAB — CMP (CANCER CENTER ONLY)
ALT: 23 U/L (ref 0–44)
AST: 19 U/L (ref 15–41)
Albumin: 4 g/dL (ref 3.5–5.0)
Alkaline Phosphatase: 52 U/L (ref 38–126)
Anion gap: 9 (ref 5–15)
BUN: 12 mg/dL (ref 6–20)
CO2: 27 mmol/L (ref 22–32)
Calcium: 9.2 mg/dL (ref 8.9–10.3)
Chloride: 104 mmol/L (ref 98–111)
Creatinine: 1.09 mg/dL (ref 0.61–1.24)
GFR, Est AFR Am: >60 mL/min (ref >60)
GFR, Estimated: >60 mL/min (ref >60)
Glucose, Bld: 100 mg/dL — ABNORMAL HIGH (ref 70–99)
Potassium: 4.4 mmol/L (ref 3.5–5.1)
Sodium: 140 mmol/L (ref 135–145)
Total Bilirubin: 0.4 mg/dL (ref 0.3–1.2)
Total Protein: 6.8 g/dL (ref 6.5–8.1)

## 2018-12-28 MED ORDER — IOHEXOL 300 MG/ML  SOLN
100.0000 mL | Freq: Once | INTRAMUSCULAR | Status: AC | PRN
  Administered 2018-12-28: 100 mL via INTRAVENOUS

## 2018-12-28 MED ORDER — SODIUM CHLORIDE (PF) 0.9 % IJ SOLN
INTRAMUSCULAR | Status: AC
  Filled 2018-12-28: qty 50

## 2018-12-29 LAB — AFP TUMOR MARKER: AFP, Serum, Tumor Marker: 3.4 ng/mL (ref 0.0–8.3)

## 2018-12-29 LAB — BETA HCG QUANT (REF LAB): hCG Quant: <1 m[IU]/mL (ref 0–3)

## 2019-01-04 ENCOUNTER — Other Ambulatory Visit: Payer: Self-pay

## 2019-01-04 ENCOUNTER — Inpatient Hospital Stay (HOSPITAL_BASED_OUTPATIENT_CLINIC_OR_DEPARTMENT_OTHER): Payer: BC Managed Care – PPO | Admitting: Oncology

## 2019-01-04 VITALS — BP 119/73 | HR 60 | Temp 98.5°F | Resp 17 | Ht 67.0 in | Wt 170.1 lb

## 2019-01-04 DIAGNOSIS — C6292 Malignant neoplasm of left testis, unspecified whether descended or undescended: Secondary | ICD-10-CM | POA: Diagnosis not present

## 2019-01-04 DIAGNOSIS — C6290 Malignant neoplasm of unspecified testis, unspecified whether descended or undescended: Secondary | ICD-10-CM | POA: Diagnosis not present

## 2019-01-04 NOTE — Progress Notes (Signed)
Hematology and Oncology Follow Up Visit  [name] [id number] 1965/06/19 53 y.o. 01/04/2019 3:13 PM Patient, No Pcp PerNo ref. provider found   Principle Diagnosis: 53 year old man with testicular cancer diagnosed in 2016.  He was found to have right seminoma staged at T2N0.   Prior Therapy:  He is status post right orchiectomy done on 08/01/2014. 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  Carboplatin adjuvant chemotherapy given at AUC of 7 first dose given on 09/22/2014. The second dose was given on 10/13/2014.   Current therapy: Active surveillance.  Interim History: [name] is here for a repeat evaluation.  Since the last visit, he reports no major changes in his health.  He continues to feel reasonably well without any complaints.  He denies any abdominal pain or recent hospitalizations.  He denies any recent illnesses or early satiety.  He remains active and attends activities of daily living.  He denied headaches, blurry vision, syncope or seizures.  Denies any fevers, chills or sweats.  Denied chest pain, palpitation, orthopnea or leg edema.  Denied cough, wheezing or hemoptysis.  Denied nausea, vomiting or abdominal pain.  Denies any constipation or diarrhea.  Denies any frequency urgency or hesitancy.  Denies any arthralgias or myalgias.  Denies any skin rashes or lesions.  Denies any bleeding or clotting tendency.  Denies any easy bruising.  Denies any hair or nail changes.  Denies any anxiety or depression.  Remaining review of system is negative.       Medications: Updated on review. Current Outpatient Medications  Medication Sig Dispense Refill  . alfuzosin (UROXATRAL) 10 MG 24 hr tablet Take 1 tablet by mouth daily.    Marland Kitchen ALPRAZolam (XANAX) 1 MG tablet Take 2 mg by mouth at bedtime.     . finasteride (PROPECIA) 1 MG tablet Take 1 mg by mouth daily.    . TRINTELLIX 20 MG TABS Take 1 tablet by mouth daily.     No current  facility-administered medications for this visit.      Allergies:  Allergies  Allergen Reactions  . Penicillins Hives    Past Medical History, Surgical history, Social history, and Family History without any changes on review.   Physical Exam: Blood pressure 119/73, pulse 60, temperature 98.5 F (36.9 C), temperature source Oral, resp. rate 17, height 5\' 7"  (1.702 m), weight 170 lb 1.6 oz (77.2 kg), SpO2 98 %.   ECOG: 0     General appearance: Alert, awake without any distress. Head: Atraumatic without abnormalities Oropharynx: Without any thrush or ulcers. Eyes: No scleral icterus. Lymph nodes: No lymphadenopathy noted in the cervical, supraclavicular, or axillary nodes Heart:regular rate and rhythm, without any murmurs or gallops.   Lung: Clear to auscultation without any rhonchi, wheezes or dullness to percussion. Abdomin: Soft, nontender without any shifting dullness or ascites. Musculoskeletal: No clubbing or cyanosis. Neurological: No motor or sensory deficits. Skin: No rashes or lesions.     Lab Results: Lab Results  Component Value Date   WBC 7.4 12/28/2018   HGB 16.1 12/28/2018   HCT 48.8 12/28/2018   MCV 90.7 12/28/2018   PLT 131 (L) 12/28/2018     Chemistry      Component Value Date/Time   NA 140 12/28/2018 0847   NA 139 12/19/2016 0814   K 4.4 12/28/2018 0847   K 4.3 12/19/2016 0814   CL 104 12/28/2018 0847   CO2 27 12/28/2018 0847   CO2 26 12/19/2016 0814  BUN 12 12/28/2018 0847   BUN 10.5 12/19/2016 0814   CREATININE 1.09 12/28/2018 0847   CREATININE 1.1 12/19/2016 0814      Component Value Date/Time   CALCIUM 9.2 12/28/2018 0847   CALCIUM 9.6 12/19/2016 0814   ALKPHOS 52 12/28/2018 0847   ALKPHOS 71 12/19/2016 0814   AST 19 12/28/2018 0847   AST 22 12/19/2016 0814   ALT 23 12/28/2018 0847   ALT 26 12/19/2016 0814   BILITOT 0.4 12/28/2018 0847   BILITOT 0.73 12/19/2016 0814      EXAM: CT CHEST, ABDOMEN, AND PELVIS WITH  CONTRAST  TECHNIQUE: Multidetector CT imaging of the chest, abdomen and pelvis was performed following the standard protocol during bolus administration of intravenous contrast.  CONTRAST:  165mL OMNIPAQUE IOHEXOL 300 MG/ML  SOLN  COMPARISON:  12/29/2017 CT chest, abdomen and pelvis.  FINDINGS: CT CHEST FINDINGS  Cardiovascular: Normal heart size. No significant pericardial effusion/thickening. Great vessels are normal in course and caliber. No central pulmonary emboli.  Mediastinum/Nodes: No discrete thyroid nodules. Unremarkable esophagus. No pathologically enlarged axillary, mediastinal or hilar lymph nodes.  Lungs/Pleura: No pneumothorax. No pleural effusion. No acute consolidative airspace disease or lung masses. Right middle lobe 3 mm solid pulmonary nodule (series 6/image 82) is stable since 01/12/2015 chest CT, considered benign. No new significant pulmonary nodules.  Musculoskeletal: No aggressive appearing focal osseous lesions. Moderate thoracic spondylosis. Surgical hardware from [facility] partially visualized in the lower cervical spine.  CT ABDOMEN PELVIS FINDINGS  Hepatobiliary: Diffuse hepatic steatosis. No definite liver surface irregularity. Central right liver lobe 4.5 cm hemangioma is stable since 01/12/2015 CT. Normal gallbladder with no radiopaque cholelithiasis. No biliary ductal dilatation.  Pancreas: Normal, with no mass or duct dilation.  Spleen: Normal size. No mass.  Adrenals/Urinary Tract: Normal adrenals. No hydronephrosis. No renal masses. Punctate nonobstructing lower left renal stone. Normal bladder.  Stomach/Bowel: Normal non-distended stomach. Normal caliber small bowel with no small bowel wall thickening. Normal appendix. Mild sigmoid diverticulosis. No large bowel wall thickening or significant pericolonic fat stranding.  Vascular/Lymphatic: Atherosclerotic nonaneurysmal abdominal aorta. Patent portal, splenic,  hepatic and renal veins. No pathologically enlarged lymph nodes in the abdomen or pelvis.  Reproductive: Normal size prostate. Postsurgical changes from right orchidectomy with right testicular prosthesis partially visualized.  Other: No pneumoperitoneum, ascites or focal fluid collection.  Musculoskeletal: No aggressive appearing focal osseous lesions.  IMPRESSION: 1. No evidence of metastatic disease in the chest, abdomen or pelvis. 2. Chronic findings include: Aortic Atherosclerosis (ICD10-I70.0). Diffuse hepatic steatosis. Mild sigmoid diverticulosis.      Impression and Plan:    53 year old man with:  1.  T2N0 pure seminoma of the right testicle diagnosed in 2016.    He remains on active surveillance after surgical resection and adjuvant therapy outlined above.  CT scan obtained on 12/28/2018 was personally reviewed and showed no evidence of relapsed disease.  The natural course of this disease and risk of relapse was assessed and at this time his risk of relapse remains very low.  I have recommended no further imaging studies as he is approaching 5 years from completion of therapy.  I have recommended annual surveillance from a survivorship standpoint but no additional imaging.  2 Thrombocytopenia: Mild in nature and without any active bleeding.  We will continue to monitor moving forward.  3.  In 1 year for repeat evaluation.  15  minutes was spent with the patient face-to-face today.  More than 50% of time was dedicated to reviewing his laboratory  data, imaging studies, disease status update and answering questions regarding plan of care.   Zola Button, MD 8/11/20203:13 PM

## 2019-01-05 ENCOUNTER — Telehealth: Payer: Self-pay | Admitting: Oncology

## 2019-01-05 NOTE — Telephone Encounter (Signed)
Scheduled per los.mailed printout

## 2020-01-02 ENCOUNTER — Other Ambulatory Visit: Payer: Self-pay

## 2020-01-02 ENCOUNTER — Emergency Department
Admission: RE | Admit: 2020-01-02 | Discharge: 2020-01-02 | Disposition: A | Discharge status: Home / Self Care | Payer: BC Managed Care – PPO | Source: Ambulatory Visit

## 2020-01-02 VITALS — BP 151/79 | HR 61 | Temp 98.4°F | Resp 16

## 2020-01-02 DIAGNOSIS — T8149XA Infection following a procedure, other surgical site, initial encounter: Secondary | ICD-10-CM | POA: Diagnosis not present

## 2020-01-02 MED ORDER — DOXYCYCLINE HYCLATE 100 MG PO CAPS: 100.0000 mg | ORAL_CAPSULE | Freq: Two times a day (BID) | ORAL | 0 refills | Status: AC

## 2020-01-02 NOTE — ED Provider Notes (Signed)
[name]    CSN: [id number] Arrival date & time: 01/02/20  1118      History   Chief Complaint Chief Complaint  Patient presents with  . Appointment    11:00  . Wound Check    HPI [name] is a 54 y.o. male.   HPI  [name] is a 54 y.o. male presenting to UC with c/o painful incision site on left upper back draining yellow discharge from where he had skin lesion removed by a dermatologist last week. He has not called the dermatologist.  He has applied neosporin with mild relief. Denies fever or chills.    Past Medical History:  Diagnosis Date  . Headache    tension h/a's  . Heart murmur   . Sleep apnea    no cpap, unable to tolerate mask  . testicular ca dx'd 07/2014   right  . Testicular mass    rt sided  . Weakness    arms and legs  . Wears glasses    for computer work    Patient Active Problem List   Diagnosis Date Noted  . Bowel habit changes 01/31/2015  . Bloating 01/31/2015  . Testis cancer ([place]) 09/15/2014    Past Surgical History:  Procedure Laterality Date  . CERVICAL SPINE SURGERY     w/ plates and screws  . ORCHIECTOMY Right 08/01/2014   Procedure: RIGHT RADICAL ORCHIECTOMY WITH PROSTESISPLACEMENT;  Surgeon: Irine Seal, MD;  Location: [facility];  Service: Urology;  Laterality: Right;  . SHOULDER ARTHROSCOPY W/ ACROMIAL REPAIR Bilateral        Home Medications    Prior to Admission medications   Medication Sig Start Date End Date Taking? Authorizing Provider  traZODone (DESYREL) 100 MG tablet Take 100 mg by mouth at bedtime.   Yes [provider]  alfuzosin (UROXATRAL) 10 MG 24 hr tablet Take 1 tablet by mouth daily. 06/05/16   [provider]  ALPRAZolam Duanne Moron) 1 MG tablet Take 2 mg by mouth at bedtime.     [provider]  Ciclopirox 1 % shampoo ciclopirox 1 % shampoo    [provider]  doxycycline (VIBRAMYCIN) 100 MG capsule Take 1 capsule (100 mg total) by  mouth 2 (two) times daily for 7 days. 01/02/20 01/09/20  Noe Gens, PA-C  finasteride (PROPECIA) 1 MG tablet Take 1 mg by mouth daily.    [provider]  indomethacin (INDOCIN) 25 MG capsule indomethacin 25 mg capsule  Take 1 capsule twice a day by oral route after meals for 30 days.    [provider]  OXcarbazepine (TRILEPTAL) 150 MG tablet Take 150 mg by mouth 2 (two) times daily. 12/12/19   [provider]  promethazine-dextromethorphan (PROMETHAZINE-DM) 6.25-15 MG/5ML syrup promethazine-DM 6.25 mg-15 mg/5 mL oral syrup    [provider]  TRINTELLIX 20 MG TABS Take 1 tablet by mouth daily. 06/03/16   [provider]  VIIBRYD 20 MG TABS Take 1 tablet by mouth daily. 11/21/19   [provider]    Family History Family History  Problem Relation Age of Onset  . Heart disease Father   . Healthy Mother     Social History Social History   Tobacco Use  . Smoking status: Former Research scientist (life sciences)  . Smokeless tobacco: Never Used  Substance Use Topics  . Alcohol use: Yes    Alcohol/week: 12.0 standard drinks    Types: 12 Cans of beer per week  Comment: socially  . Drug use: No     Allergies   Penicillins   Review of Systems Review of Systems  Constitutional: Negative for chills and fever.  Skin: Positive for color change and wound.     Physical Exam Triage Vital Signs ED Triage Vitals  Enc Vitals Group     BP 01/02/20 1134 (!) 151/79     Pulse Rate 01/02/20 1134 61     Resp 01/02/20 1134 16     Temp 01/02/20 1134 98.4 F (36.9 C)     Temp Source 01/02/20 1134 Oral     SpO2 01/02/20 1134 99 %     Weight --      Height --      Head Circumference --      Peak Flow --      Pain Score 01/02/20 1130 2     Pain Loc --      Pain Edu? --      Excl. in [place]? --    No data found.  Updated Vital Signs BP (!) 151/79 (BP Location: Right Arm)   Pulse 61   Temp 98.4 F (36.9 C) (Oral)   Resp 16   SpO2 99%   Visual  Acuity Right Eye Distance:   Left Eye Distance:   Bilateral Distance:    Right Eye Near:   Left Eye Near:    Bilateral Near:     Physical Exam Vitals and nursing note reviewed.  Constitutional:      Appearance: He is well-developed.  HENT:     Head: Normocephalic and atraumatic.  Cardiovascular:     Rate and Rhythm: Normal rate.  Pulmonary:     Effort: Pulmonary effort is normal.  Musculoskeletal:        General: Normal range of motion.     Cervical back: Normal range of motion.  Skin:    General: Skin is warm and dry.       Neurological:     Mental Status: He is alert and oriented to person, place, and time.  Psychiatric:        Behavior: Behavior normal.      UC Treatments / Results  Labs (all labs ordered are listed, but only abnormal results are displayed) Labs Reviewed - No data to display  EKG   Radiology No results found.  Procedures Procedures (including critical care time)  Medications Ordered in UC Medications - No data to display  Initial Impression / Assessment and Plan / UC Course  I have reviewed the triage vital signs and the nursing notes.  Pertinent labs & imaging results that were available during my care of the patient were reviewed by me and considered in my medical decision making (see chart for details).     Incision site appears to becoming infected Will tx with doxycycline, pt allergic to [facility] Encouraged to call his dermatologist AVS given  Final Clinical Impressions(s) / UC Diagnoses   Final diagnoses:  Infected incision     Discharge Instructions      Keep area clean with warm water and mild soap. Pat dry. Cover with bandage Call your dermatologist for recheck of symptoms later this week.     ED Prescriptions    Medication Sig Dispense Auth. Provider   doxycycline (VIBRAMYCIN) 100 MG capsule Take 1 capsule (100 mg total) by mouth 2 (two) times daily for 7 days. 14 capsule Noe Gens, [place]     PDMP not  reviewed this encounter.  Noe Gens, [place] 01/05/20 1242

## 2020-01-02 NOTE — ED Triage Notes (Signed)
Patient presents to Urgent Care with complaints of possible infected wound on his back since a spot was removed by the dermatologist last week. Patient reports it has been having some drainage, enough to stick to his shirt; is worried it might be getting infected.  Pt has been putting neosporin on it but cannot reach it, so relies on his kids to help him.

## 2020-01-02 NOTE — Discharge Instructions (Signed)
  Keep area clean with warm water and mild soap. Pat dry. Cover with bandage Call your dermatologist for recheck of symptoms later this week.

## 2020-01-03 ENCOUNTER — Inpatient Hospital Stay: Payer: BC Managed Care – PPO

## 2020-01-03 ENCOUNTER — Inpatient Hospital Stay: Payer: BC Managed Care – PPO | Attending: Oncology | Admitting: Oncology

## 2020-01-03 ENCOUNTER — Other Ambulatory Visit: Payer: Self-pay

## 2020-01-03 VITALS — BP 131/60 | HR 64 | Temp 97.0°F | Resp 18

## 2020-01-03 DIAGNOSIS — R1310 Dysphagia, unspecified: Secondary | ICD-10-CM | POA: Insufficient documentation

## 2020-01-03 DIAGNOSIS — Z880 Allergy status to penicillin: Secondary | ICD-10-CM | POA: Insufficient documentation

## 2020-01-03 DIAGNOSIS — C6292 Malignant neoplasm of left testis, unspecified whether descended or undescended: Secondary | ICD-10-CM

## 2020-01-03 DIAGNOSIS — Z79899 Other long term (current) drug therapy: Secondary | ICD-10-CM | POA: Diagnosis not present

## 2020-01-03 DIAGNOSIS — D6959 Other secondary thrombocytopenia: Secondary | ICD-10-CM | POA: Diagnosis not present

## 2020-01-03 DIAGNOSIS — C6290 Malignant neoplasm of unspecified testis, unspecified whether descended or undescended: Secondary | ICD-10-CM | POA: Diagnosis present

## 2020-01-03 DIAGNOSIS — J029 Acute pharyngitis, unspecified: Secondary | ICD-10-CM | POA: Diagnosis not present

## 2020-01-03 LAB — CBC WITH DIFFERENTIAL (CANCER CENTER ONLY)
Abs Immature Granulocytes: 0.01 10*3/uL (ref 0.00–0.07)
Basophils Absolute: 0 10*3/uL (ref 0.0–0.1)
Basophils Relative: 1 %
Eosinophils Absolute: 0.2 10*3/uL (ref 0.0–0.5)
Eosinophils Relative: 4 %
HCT: 49.4 % (ref 39.0–52.0)
Hemoglobin: 16.4 g/dL (ref 13.0–17.0)
Immature Granulocytes: 0 %
Lymphocytes Relative: 34 %
Lymphs Abs: 1.9 10*3/uL (ref 0.7–4.0)
MCH: 29.2 pg (ref 26.0–34.0)
MCHC: 33.2 g/dL (ref 30.0–36.0)
MCV: 87.9 fL (ref 80.0–100.0)
Monocytes Absolute: 0.4 10*3/uL (ref 0.1–1.0)
Monocytes Relative: 8 %
Neutro Abs: 3 10*3/uL (ref 1.7–7.7)
Neutrophils Relative %: 53 %
Platelet Count: 116 10*3/uL — ABNORMAL LOW (ref 150–400)
RBC: 5.62 MIL/uL (ref 4.22–5.81)
RDW: 13.2 % (ref 11.5–15.5)
WBC Count: 5.5 10*3/uL (ref 4.0–10.5)
nRBC: 0 % (ref 0.0–0.2)

## 2020-01-03 LAB — CMP (CANCER CENTER ONLY)
ALT: 28 U/L (ref 0–44)
AST: 26 U/L (ref 15–41)
Albumin: 3.9 g/dL (ref 3.5–5.0)
Alkaline Phosphatase: 64 U/L (ref 38–126)
Anion gap: 9 (ref 5–15)
BUN: 13 mg/dL (ref 6–20)
CO2: 28 mmol/L (ref 22–32)
Calcium: 9.7 mg/dL (ref 8.9–10.3)
Chloride: 104 mmol/L (ref 98–111)
Creatinine: 1.19 mg/dL (ref 0.61–1.24)
GFR, Est AFR Am: >60 mL/min (ref >60)
GFR, Estimated: >60 mL/min (ref >60)
Glucose, Bld: 165 mg/dL — ABNORMAL HIGH (ref 70–99)
Potassium: 4 mmol/L (ref 3.5–5.1)
Sodium: 141 mmol/L (ref 135–145)
Total Bilirubin: 0.4 mg/dL (ref 0.3–1.2)
Total Protein: 7.1 g/dL (ref 6.5–8.1)

## 2020-01-03 LAB — LACTATE DEHYDROGENASE: LDH: 178 U/L (ref 98–192)

## 2020-01-03 NOTE — Addendum Note (Signed)
Addended by: Wyatt Portela on: 01/03/2020 10:21 AM   Modules accepted: Orders

## 2020-01-03 NOTE — Progress Notes (Addendum)
Hematology and Oncology Follow Up Visit  [name] [id number] January 11, 1966 54 y.o. 01/03/2020 9:59 AM Patient, No Pcp PerNo ref. provider found   Principle Diagnosis: 54 year old man with T2N0 pure seminoma diagnosed in 2016.   Prior Therapy:  He is status post right orchiectomy done on 08/01/2014. 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  Carboplatin adjuvant chemotherapy given at AUC of 7 first dose given on 09/22/2014. The second dose was given on 10/13/2014.   Current therapy: Active surveillance.  Interim History: Mr. Seifer presents today for a repeat evaluation.  Since the last visit, he reports no major complaints.  He does report persistent sore throat and occasional difficulty swallowing in the last 2 weeks.  He denies any nausea, vomiting or abdominal pain.  Denies any ear pain or discomfort.  Continues to eat well and has gained weight.  He denies any fevers chills or sweats.       Medications: Updated on review. Current Outpatient Medications  Medication Sig Dispense Refill  . alfuzosin (UROXATRAL) 10 MG 24 hr tablet Take 1 tablet by mouth daily.    Marland Kitchen ALPRAZolam (XANAX) 1 MG tablet Take 2 mg by mouth at bedtime.     Marland Kitchen doxycycline (VIBRAMYCIN) 100 MG capsule Take 1 capsule (100 mg total) by mouth 2 (two) times daily for 7 days. 14 capsule 0  . finasteride (PROPECIA) 1 MG tablet Take 1 mg by mouth daily.    . traZODone (DESYREL) 100 MG tablet Take 100 mg by mouth at bedtime.    . TRINTELLIX 20 MG TABS Take 1 tablet by mouth daily.     No current facility-administered medications for this visit.     Allergies:  Allergies  Allergen Reactions  . Penicillins Hives     Physical Exam: Blood pressure 131/60, pulse 64, temperature (!) 97 F (36.1 C), temperature source Oral, resp. rate 18, SpO2 99 %.    ECOG: 0     General appearance: Comfortable appearing without any discomfort Head: Normocephalic without any trauma Oropharynx:  Mucous membranes are moist and pink without any thrush or ulcers.  I could not appreciate any masses or lesions. Eyes: Pupils are equal and round reactive to light. Lymph nodes: No cervical, supraclavicular, inguinal or axillary lymphadenopathy.   Heart:regular rate and rhythm.  S1 and S2 without leg edema. Lung: Clear without any rhonchi or wheezes.  No dullness to percussion. Abdomin: Soft, nontender, nondistended with good bowel sounds.  No hepatosplenomegaly. Musculoskeletal: No joint deformity or effusion.  Full range of motion noted. Neurological: No deficits noted on motor, sensory and deep tendon reflex exam. Skin: No petechial rash or dryness.  Appeared moist.       Lab Results: Lab Results  Component Value Date   WBC 7.4 12/28/2018   HGB 16.1 12/28/2018   HCT 48.8 12/28/2018   MCV 90.7 12/28/2018   PLT 131 (L) 12/28/2018     Chemistry      Component Value Date/Time   NA 140 12/28/2018 0847   NA 139 12/19/2016 0814   K 4.4 12/28/2018 0847   K 4.3 12/19/2016 0814   CL 104 12/28/2018 0847   CO2 27 12/28/2018 0847   CO2 26 12/19/2016 0814   BUN 12 12/28/2018 0847   BUN 10.5 12/19/2016 0814   CREATININE 1.09 12/28/2018 0847   CREATININE 1.1 12/19/2016 0814      Component Value Date/Time   CALCIUM 9.2 12/28/2018 0847   CALCIUM 9.6 12/19/2016 0814  ALKPHOS 52 12/28/2018 0847   ALKPHOS 71 12/19/2016 0814   AST 19 12/28/2018 0847   AST 22 12/19/2016 0814   ALT 23 12/28/2018 0847   ALT 26 12/19/2016 0814   BILITOT 0.4 12/28/2018 0847   BILITOT 0.73 12/19/2016 0814          Impression and Plan:    54 year old man with:  1.  Right testicular seminoma diagnosed in 2016.  He was found to have T2N0 pure seminoma at that time.  He is over 5 years of therapy at this time without any evidence of relapsed disease.  The natural course of this disease and risk of relapse was assessed and at this time he has no evidence to suggest relapse and risk of relapse  remains low although not zero given his history of seminoma.  I recommended continued annual monitoring at this time with repeat imaging studies as needed.  He is agreeable to continue at this time.   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  His platelet count continues to be above 100,000 without any need for intervention.  3.  Survivorship considerations: Annual monitoring of his CBC as well as age-appropriate cancer screening were reiterated.  He has completed his Covid vaccination series.  I urged him to get a colonoscopy as well.  4.  Sore throat and dysphagia: Referral to ENT made an urgent follow-up with.  5.  Follow-up: He will return in 1 year and sooner if needed.  30  minutes were dedicated to this visit. The time was spent on reviewing laboratory data, disease status update, risk of relapse assessment and answering questions regarding future plan.   Zola Button, MD 8/10/20219:59 AM

## 2020-01-04 LAB — BETA HCG QUANT (REF LAB): hCG Quant: <1 m[IU]/mL (ref 0–3)

## 2020-01-04 LAB — AFP TUMOR MARKER: AFP, Serum, Tumor Marker: 3.6 ng/mL (ref 0.0–8.3)

## 2020-03-09 ENCOUNTER — Telehealth: Payer: Self-pay

## 2020-03-09 NOTE — Telephone Encounter (Signed)
Called patient and made him aware of Dr. Hazeline Junker response below. Patient verbalized understanding.

## 2020-03-09 NOTE — Telephone Encounter (Signed)
-----   Message from Wyatt Portela, MD sent at 03/09/2020 10:51 AM EDT ----- Dr. Gladstone Lighter can order the scan. If there is anything abnormal that requires my involvement, we will take care of it. Thanks ----- Message ----- From: Tami Lin, RN Sent: 03/09/2020  10:40 AM EDT To: Wyatt Portela, MD  Patient called and stated he saw Dr. Gladstone Lighter at [facility] today and had an x ray of his shoulder. Per patient, "Dr. Gladstone Lighter saw a large white spot on the rib or lung and wants to do a CT scan for further evaluation". Patient wants to know if he should come see you for follow up and ordering of scans or just let Dr. Gladstone Lighter proceed with ordering imaging. Lanelle Bal

## 2020-03-12 ENCOUNTER — Other Ambulatory Visit: Payer: Self-pay | Admitting: Orthopedic Surgery

## 2020-03-12 DIAGNOSIS — M25511 Pain in right shoulder: Secondary | ICD-10-CM

## 2020-03-19 ENCOUNTER — Emergency Department (HOSPITAL_COMMUNITY)
Admission: EM | Admit: 2020-03-19 | Discharge: 2020-03-19 | Discharge status: Against Medical Advice | Payer: BC Managed Care – PPO

## 2020-03-19 ENCOUNTER — Other Ambulatory Visit: Payer: Self-pay

## 2020-03-19 NOTE — ED Notes (Signed)
Pt notified registration staff that he is leaving

## 2020-03-27 ENCOUNTER — Ambulatory Visit
Admission: RE | Admit: 2020-03-27 | Discharge: 2020-03-27 | Disposition: A | Discharge status: Home / Self Care | Payer: BC Managed Care – PPO | Source: Ambulatory Visit | Attending: Orthopedic Surgery | Admitting: Orthopedic Surgery

## 2020-03-27 ENCOUNTER — Other Ambulatory Visit: Payer: Self-pay

## 2020-03-27 DIAGNOSIS — M25511 Pain in right shoulder: Secondary | ICD-10-CM

## 2020-07-23 ENCOUNTER — Telehealth: Payer: Self-pay | Admitting: *Deleted

## 2020-07-23 NOTE — Telephone Encounter (Signed)
-----   Message from [facility] sent at 07/23/2020  2:47 PM EST -----  Scheduling Message Entered by Guy Sandifer on 07/23/2020 at 12:06 PM Priority: Routine <No visit type provided> Department: [facility] Provider: Wyatt Portela, MD Scheduling Notes: Schedule appt before the August date    Pt called in and wanted to move his August appts up - says he is having some new pains and he just doesn't feel right. He found a lump - not sure if he needs to be worked in [place] can you give him and call and let me know when I schedule . If he needs to see Dr. Alen Blew of [facility]

## 2020-07-23 NOTE — Telephone Encounter (Signed)
Mr Rakes states he has been having pain in his lower back, found a lump. Has been weak. Was trying to lose weight, but has lost 30 lbs since June- feels like it is coming off too fast.    Please advise

## 2020-07-24 ENCOUNTER — Telehealth: Payer: Self-pay

## 2020-07-24 NOTE — Telephone Encounter (Signed)
Can you call Mr Majkowski?

## 2020-07-24 NOTE — Telephone Encounter (Signed)
I don't think any of this is related to his cancer.

## 2020-07-24 NOTE — Telephone Encounter (Signed)
-----   Message from Wyatt Portela, MD sent at 07/24/2020  8:35 AM EST ----- He can come for labs this week. I sent a message to scheduling to get him a visit with me on 3/14.  ----- Message ----- From: Tami Lin, RN Sent: 07/24/2020   8:31 AM EST To: Wyatt Portela, MD  Tammi sent you the message below yesterday and you responded that you don't think these symptoms are related to patient's cancer. Patient made aware and wants to know if he can just come in for lab work. [name]  Mr Dreibelbis states he has been having pain in his lower back, found a lump. Has been weak. Was trying to lose weight, but has lost 30 lbs since June- feels like it is coming off too fast.    Please advise

## 2020-07-24 NOTE — Telephone Encounter (Signed)
Called patient and made him aware of Dr. Hazeline Junker response. Patient aware to expect a call from scheduling to schedule lab appointment this week and a follow up with Dr. Alen Blew on 3/14. Patient verbalized understanding.

## 2020-07-25 ENCOUNTER — Other Ambulatory Visit: Payer: Self-pay | Admitting: Oncology

## 2020-07-25 ENCOUNTER — Other Ambulatory Visit: Payer: Self-pay

## 2020-07-25 ENCOUNTER — Inpatient Hospital Stay: Payer: BC Managed Care – PPO | Attending: Oncology

## 2020-07-25 DIAGNOSIS — Z880 Allergy status to penicillin: Secondary | ICD-10-CM | POA: Diagnosis not present

## 2020-07-25 DIAGNOSIS — C6292 Malignant neoplasm of left testis, unspecified whether descended or undescended: Secondary | ICD-10-CM

## 2020-07-25 DIAGNOSIS — R634 Abnormal weight loss: Secondary | ICD-10-CM | POA: Diagnosis not present

## 2020-07-25 DIAGNOSIS — C6291 Malignant neoplasm of right testis, unspecified whether descended or undescended: Secondary | ICD-10-CM | POA: Insufficient documentation

## 2020-07-25 DIAGNOSIS — D6959 Other secondary thrombocytopenia: Secondary | ICD-10-CM | POA: Insufficient documentation

## 2020-07-25 DIAGNOSIS — Z79899 Other long term (current) drug therapy: Secondary | ICD-10-CM | POA: Diagnosis not present

## 2020-07-25 DIAGNOSIS — Z9221 Personal history of antineoplastic chemotherapy: Secondary | ICD-10-CM | POA: Insufficient documentation

## 2020-07-25 DIAGNOSIS — R109 Unspecified abdominal pain: Secondary | ICD-10-CM | POA: Diagnosis not present

## 2020-07-25 LAB — CMP (CANCER CENTER ONLY)
ALT: 22 U/L (ref 0–44)
AST: 20 U/L (ref 15–41)
Albumin: 4 g/dL (ref 3.5–5.0)
Alkaline Phosphatase: 61 U/L (ref 38–126)
Anion gap: 9 (ref 5–15)
BUN: 16 mg/dL (ref 6–20)
CO2: 25 mmol/L (ref 22–32)
Calcium: 9.1 mg/dL (ref 8.9–10.3)
Chloride: 106 mmol/L (ref 98–111)
Creatinine: 1.22 mg/dL (ref 0.61–1.24)
GFR, Estimated: >60 mL/min (ref >60)
Glucose, Bld: 93 mg/dL (ref 70–99)
Potassium: 4.3 mmol/L (ref 3.5–5.1)
Sodium: 140 mmol/L (ref 135–145)
Total Bilirubin: 0.7 mg/dL (ref 0.3–1.2)
Total Protein: 6.9 g/dL (ref 6.5–8.1)

## 2020-07-25 LAB — CBC WITH DIFFERENTIAL (CANCER CENTER ONLY)
Abs Immature Granulocytes: 0.02 10*3/uL (ref 0.00–0.07)
Basophils Absolute: 0.1 10*3/uL (ref 0.0–0.1)
Basophils Relative: 1 %
Eosinophils Absolute: 0.2 10*3/uL (ref 0.0–0.5)
Eosinophils Relative: 3 %
HCT: 46.8 % (ref 39.0–52.0)
Hemoglobin: 15.3 g/dL (ref 13.0–17.0)
Immature Granulocytes: 0 %
Lymphocytes Relative: 34 %
Lymphs Abs: 2.2 10*3/uL (ref 0.7–4.0)
MCH: 29.3 pg (ref 26.0–34.0)
MCHC: 32.7 g/dL (ref 30.0–36.0)
MCV: 89.5 fL (ref 80.0–100.0)
Monocytes Absolute: 0.7 10*3/uL (ref 0.1–1.0)
Monocytes Relative: 11 %
Neutro Abs: 3.2 10*3/uL (ref 1.7–7.7)
Neutrophils Relative %: 51 %
Platelet Count: 144 10*3/uL — ABNORMAL LOW (ref 150–400)
RBC: 5.23 MIL/uL (ref 4.22–5.81)
RDW: 12.8 % (ref 11.5–15.5)
WBC Count: 6.3 10*3/uL (ref 4.0–10.5)
nRBC: 0 % (ref 0.0–0.2)

## 2020-07-25 LAB — LACTATE DEHYDROGENASE: LDH: 148 U/L (ref 98–192)

## 2020-07-26 LAB — BETA HCG QUANT (REF LAB): hCG Quant: <1 m[IU]/mL (ref 0–3)

## 2020-07-26 LAB — AFP TUMOR MARKER: AFP, Serum, Tumor Marker: 3.4 ng/mL (ref 0.0–8.3)

## 2020-08-06 ENCOUNTER — Telehealth: Payer: Self-pay | Admitting: Oncology

## 2020-08-06 ENCOUNTER — Other Ambulatory Visit: Payer: Self-pay | Admitting: *Deleted

## 2020-08-06 ENCOUNTER — Inpatient Hospital Stay: Payer: BC Managed Care – PPO | Admitting: Oncology

## 2020-08-06 ENCOUNTER — Other Ambulatory Visit: Payer: Self-pay

## 2020-08-06 VITALS — BP 103/59 | HR 59 | Temp 97.9°F | Resp 14 | Ht 67.0 in | Wt 159.6 lb

## 2020-08-06 DIAGNOSIS — C6292 Malignant neoplasm of left testis, unspecified whether descended or undescended: Secondary | ICD-10-CM | POA: Diagnosis not present

## 2020-08-06 DIAGNOSIS — C6291 Malignant neoplasm of right testis, unspecified whether descended or undescended: Secondary | ICD-10-CM | POA: Diagnosis not present

## 2020-08-06 NOTE — Telephone Encounter (Signed)
Checked out appointment. No LOS notes needing to be scheduled. No changes made. 

## 2020-08-06 NOTE — Progress Notes (Signed)
Hematology and Oncology Follow Up Visit  [name] [id number] 10/22/1965 55 y.o. 08/06/2020 9:17 AM Patient, No Pcp PerNo ref. provider found   Principle Diagnosis: 55 year old man with right testicular seminoma diagnosed in 2016.  He was found to have T2N0 pure seminoma at that time.  Prior Therapy:  He is status post right orchiectomy done on 08/01/2014. 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  Carboplatin adjuvant chemotherapy given at AUC of 7 first dose given on 09/22/2014. The second dose was given on 10/13/2014.   Current therapy: Active surveillance.  Interim History: [name] returns today for a follow-up visit.  Since the last visit, he reports no specific complaints but overall not feeling well.  He reports vague complaints of weight loss and back stiffness that has been going on for the last few months.  He denies any neurological deficits weakness or decline in his performance status or energy level.  He is working full-time and exercises for times a week.  He has a reported lower back discomfort and has been intermittent not associated with any other complaints.  He is eating reasonably well but feels that he has lost close to 20 pounds.       Medications: Unchanged on review. Current Outpatient Medications  Medication Sig Dispense Refill  . alfuzosin (UROXATRAL) 10 MG 24 hr tablet Take 1 tablet by mouth daily.    Marland Kitchen ALPRAZolam (XANAX) 1 MG tablet Take 2 mg by mouth at bedtime.     . Ciclopirox 1 % shampoo ciclopirox 1 % shampoo    . finasteride (PROPECIA) 1 MG tablet Take 1 mg by mouth daily.    . indomethacin (INDOCIN) 25 MG capsule indomethacin 25 mg capsule  Take 1 capsule twice a day by oral route after meals for 30 days.    . OXcarbazepine (TRILEPTAL) 150 MG tablet Take 150 mg by mouth 2 (two) times daily.    . promethazine-dextromethorphan (PROMETHAZINE-DM) 6.25-15 MG/5ML syrup promethazine-DM 6.25 mg-15 mg/5 mL oral syrup    .  traZODone (DESYREL) 100 MG tablet Take 100 mg by mouth at bedtime.    . TRINTELLIX 20 MG TABS Take 1 tablet by mouth daily.    Marland Kitchen VIIBRYD 20 MG TABS Take 1 tablet by mouth daily.     No current facility-administered medications for this visit.     Allergies:  Allergies  Allergen Reactions  . Penicillins Hives     Physical Exam:  Blood pressure (!) 103/59, pulse (!) 59, temperature 97.9 F (36.6 C), temperature source Tympanic, resp. rate 14, height 5\' 7"  (1.702 m), weight 159 lb 9.6 oz (72.4 kg), SpO2 100 %.    ECOG: 0      General appearance: Alert, awake without any distress. Head: Atraumatic without abnormalities Oropharynx: Without any thrush or ulcers. Eyes: No scleral icterus. Lymph nodes: No lymphadenopathy noted in the cervical, supraclavicular, or axillary nodes Heart:regular rate and rhythm, without any murmurs or gallops.   Lung: Clear to auscultation without any rhonchi, wheezes or dullness to percussion. Abdomin: Soft, nontender without any shifting dullness or ascites. Musculoskeletal: No clubbing or cyanosis. Neurological: No motor or sensory deficits. Skin: No rashes or lesions.        Lab Results: Lab Results  Component Value Date   WBC 6.3 07/25/2020   HGB 15.3 07/25/2020   HCT 46.8 07/25/2020   MCV 89.5 07/25/2020   PLT 144 (L) 07/25/2020     Chemistry  Component Value Date/Time   NA 140 07/25/2020 0854   NA 139 12/19/2016 0814   K 4.3 07/25/2020 0854   K 4.3 12/19/2016 0814   CL 106 07/25/2020 0854   CO2 25 07/25/2020 0854   CO2 26 12/19/2016 0814   BUN 16 07/25/2020 0854   BUN 10.5 12/19/2016 0814   CREATININE 1.22 07/25/2020 0854   CREATININE 1.1 12/19/2016 0814      Component Value Date/Time   CALCIUM 9.1 07/25/2020 0854   CALCIUM 9.6 12/19/2016 0814   ALKPHOS 61 07/25/2020 0854   ALKPHOS 71 12/19/2016 0814   AST 20 07/25/2020 0854   AST 22 12/19/2016 0814   ALT 22 07/25/2020 0854   ALT 26 12/19/2016 0814    BILITOT 0.7 07/25/2020 0854   BILITOT 0.73 12/19/2016 0814          Impression and Plan:    55 year old man with:  1.  Seminoma of the right testicle diagnosed in 2016.  He was found to haveT2N0 disease.  His disease status was updated at this time and risk of relapse was assessed.  He status post surgical resection followed by adjuvant chemotherapy without any evidence of relapse.  Imaging studies obtained in 2020 and in 2021 did not show any evidence of disease relapse.  At this time I recommended continued annual surveillance with repeat imaging studies as needed.  Given his vague complaints of weight loss and abdominal pain.  Will obtain imaging studies of the abdomen and pelvis.  No imaging scans of the chest is needed.  If his scans are within normal range, no further investigation is needed.  Laboratory data obtained on July 25, 2020 were reviewed and showed no abnormalities including normal tumor markers, CBC, chemistries and liver function test.  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  Platelet count is close to normal range.  3.  Age-appropriate health maintenance: I recommend that he has follow-up with a primary care physician regarding his screening for cholesterol, thyroid disease.  4.  Follow-up: Already scheduled for August 2022.  This will be sooner if his scan is abnormal.   30  minutes were spent on this encounter.  The time was dedicated to reviewing his disease status, discussing risk of relapse, addressing complications to his previous cancer and cancer therapy.   Zola Button, MD 3/14/20229:17 AM

## 2020-08-16 ENCOUNTER — Telehealth: Payer: Self-pay

## 2020-08-16 ENCOUNTER — Ambulatory Visit (HOSPITAL_COMMUNITY)
Admission: RE | Admit: 2020-08-16 | Discharge: 2020-08-16 | Disposition: A | Discharge status: Home / Self Care | Payer: BC Managed Care – PPO | Source: Ambulatory Visit | Attending: Oncology | Admitting: Oncology

## 2020-08-16 ENCOUNTER — Other Ambulatory Visit: Payer: Self-pay

## 2020-08-16 ENCOUNTER — Encounter (HOSPITAL_COMMUNITY): Payer: Self-pay

## 2020-08-16 DIAGNOSIS — C6292 Malignant neoplasm of left testis, unspecified whether descended or undescended: Secondary | ICD-10-CM | POA: Diagnosis present

## 2020-08-16 MED ORDER — IOHEXOL 300 MG/ML  SOLN
100.0000 mL | Freq: Once | INTRAMUSCULAR | Status: AC | PRN
  Administered 2020-08-16: 100 mL via INTRAVENOUS

## 2020-08-16 NOTE — Telephone Encounter (Signed)
-----   Message from Wyatt Portela, MD sent at 08/16/2020  3:26 PM EDT ----- Please let him know his scan is normal.

## 2020-08-16 NOTE — Telephone Encounter (Signed)
Called patient and made him aware that per Dr. Alen Blew his scan is normal. Patient verbalized understanding.

## 2020-11-08 ENCOUNTER — Telehealth: Payer: Self-pay | Admitting: Oncology

## 2020-11-08 NOTE — Telephone Encounter (Signed)
Scheduled  appointment per 06/16 sch msg. Patient is aware. 

## 2020-11-22 ENCOUNTER — Emergency Department
Admission: RE | Admit: 2020-11-22 | Discharge: 2020-11-22 | Disposition: A | Discharge status: Home / Self Care | Payer: BC Managed Care – PPO | Source: Ambulatory Visit

## 2020-11-22 ENCOUNTER — Other Ambulatory Visit: Payer: Self-pay

## 2020-11-22 VITALS — BP 133/86 | HR 62 | Temp 98.6°F

## 2020-11-22 DIAGNOSIS — H00012 Hordeolum externum right lower eyelid: Secondary | ICD-10-CM | POA: Diagnosis not present

## 2020-11-22 MED ORDER — NEO-POLYCIN HC 1 % OP OINT: TOPICAL_OINTMENT | Freq: Three times a day (TID) | OPHTHALMIC | 0 refills | Status: AC

## 2020-11-22 NOTE — ED Provider Notes (Signed)
[name]    CSN: [id number] Arrival date & time: 11/22/20  1148      History   Chief Complaint Chief Complaint  Patient presents with   Appointment    HPI [name] is a 55 y.o. male.   HPI 55 year old male presents with right lower eyelid Blepharitis reports having allergic reaction to erythromycin ophthalmic, then was switched to combination of tobramycin ophthalmic and oral doxycycline that only reduced size of of the left lower eyelid hordeolum slightly without complete resolution.  Patient is currently not followed by PCP and reports optometry for glasses prescriptions are done via telemetry doc.   Past Medical History:  Diagnosis Date   Headache    tension h/a's   Heart murmur    Sleep apnea    no cpap, unable to tolerate mask   testicular ca dx'd 07/2014   right   Testicular mass    rt sided   Weakness    arms and legs   Wears glasses    for computer work    Patient Active Problem List   Diagnosis Date Noted   Bowel habit changes 01/31/2015   Bloating 01/31/2015   Testis cancer ([place]) 09/15/2014    Past Surgical History:  Procedure Laterality Date   CERVICAL SPINE SURGERY     w/ plates and screws   ORCHIECTOMY Right 08/01/2014   Procedure: [place];  Surgeon: Irine Seal, MD;  Location: [facility];  Service: Urology;  Laterality: Right;   SHOULDER ARTHROSCOPY W/ ACROMIAL REPAIR Bilateral        Home Medications    Prior to Admission medications   Medication Sig Start Date End Date Taking? Authorizing Provider  baci-polymyx-neo-hydrocort (NEO-POLYCIN HC) 1 % ointment Place into the left eye 3 (three) times daily for 5 days. 11/22/20 11/27/20 Yes Eliezer Lofts, FNP  finasteride (PROPECIA) 1 MG tablet Take 1 mg by mouth daily.   Yes [provider]  OXcarbazepine (TRILEPTAL) 150 MG tablet Take 150 mg by mouth 2 (two) times daily. 12/12/19  Yes [provider]   traZODone (DESYREL) 100 MG tablet Take 100 mg by mouth at bedtime.   Yes [provider]  VIIBRYD 20 MG TABS Take 1 tablet by mouth daily. 11/21/19  Yes [provider]    Family History Family History  Problem Relation Age of Onset   Heart disease Father    Healthy Mother     Social History Social History   Tobacco Use   Smoking status: Former    Pack years: 0.00   Smokeless tobacco: Never  Substance Use Topics   Alcohol use: Yes    Alcohol/week: 12.0 standard drinks    Types: 12 Cans of beer per week    Comment: socially   Drug use: No     Allergies   Penicillins   Review of Systems Review of Systems  Eyes:  Positive for redness.       Hordeolum (externum) right lower eye lid x 1 month  All other systems reviewed and are negative.   Physical Exam Triage Vital Signs ED Triage Vitals  Enc Vitals Group     BP 11/22/20 1204 133/86     Pulse Rate 11/22/20 1204 62     Resp --      Temp 11/22/20 1204 98.6 F (37 C)     Temp Source 11/22/20 1204 Oral     SpO2 11/22/20 1204 99 %  Weight --      Height --      Head Circumference --      Peak Flow --      Pain Score 11/22/20 1205 0     Pain Loc --      Pain Edu? --      Excl. in [place]? --    No data found.  Updated Vital Signs BP 133/86 (BP Location: Left Arm)   Pulse 62   Temp 98.6 F (37 C) (Oral)   SpO2 99%    Physical Exam Vitals and nursing note reviewed.  Constitutional:      General: He is not in acute distress.    Appearance: Normal appearance. He is normal weight. He is not ill-appearing.  HENT:     Head: Normocephalic and atraumatic.     Mouth/Throat:     Mouth: Mucous membranes are moist.     Pharynx: Oropharynx is clear.  Eyes:     Extraocular Movements: Extraocular movements intact.     Conjunctiva/sclera: Conjunctivae normal.     Pupils: Pupils are equal, round, and reactive to light.     Comments: Right I/lower eyelid: Acute plugging of meibomian gland  erythematous bump of lateral lower eyelid noted  Cardiovascular:     Rate and Rhythm: Normal rate and regular rhythm.     Pulses: Normal pulses.     Heart sounds: Normal heart sounds.  Pulmonary:     Effort: Pulmonary effort is normal. No respiratory distress.     Breath sounds: Normal breath sounds. No wheezing, rhonchi or rales.  Musculoskeletal:        General: Normal range of motion.     Cervical back: Normal range of motion and neck supple.  Skin:    General: Skin is warm.  Neurological:     General: No focal deficit present.     Mental Status: He is alert and oriented to person, place, and time.  Psychiatric:        Mood and Affect: Mood normal.        Behavior: Behavior normal.     UC Treatments / Results  Labs (all labs ordered are listed, but only abnormal results are displayed) Labs Reviewed - No data to display  EKG   Radiology No results found.  Procedures Procedures (including critical care time)  Medications Ordered in UC Medications - No data to display  Initial Impression / Assessment and Plan / UC Course  I have reviewed the triage vital signs and the nursing notes.  Pertinent labs & imaging results that were available during my care of the patient were reviewed by me and considered in my medical decision making (see chart for details).    MDM: 1.  Hordeolum externum of right lower eyelid-Rx'd Neo-Polycin HC 1% TID x 5 days.  Advised/strongly encourage patient if symptoms worsen and or unresolved please follow-up with ophthalmology/optometry. Final Clinical Impressions(s) / UC Diagnoses   Final diagnoses:  Hordeolum externum of right lower eyelid     Discharge Instructions      Advised/instructed patient to discontinue all other previously prescribed medications.  Advised patient to take medication as directed.  Advised patient if symptoms worsen and/or unresolved please follow-up with ophthalmology/optometry.     ED Prescriptions      Medication Sig Dispense Auth. Provider   baci-polymyx-neo-hydrocort (NEO-POLYCIN HC) 1 % ointment Place into the left eye 3 (three) times daily for 5 days. 3.5 g Eliezer Lofts, FNP  PDMP not reviewed this encounter.   Eliezer Lofts, [place] 11/22/20 1308

## 2020-11-22 NOTE — ED Triage Notes (Signed)
Patient states that he has a stye on his right eye x 1 month.  Patient has been given 2 different prescriptions via a "tele doc" visit.  Patient has tried Tobramycin and Doxycycline and it hasn't helped.  The stye is not painful.

## 2020-11-22 NOTE — Discharge Instructions (Addendum)
Advised/instructed patient to discontinue all other previously prescribed medications.  Advised patient to take medication as directed.  Advised patient if symptoms worsen and/or unresolved please follow-up with ophthalmology/optometry.

## 2021-01-02 ENCOUNTER — Other Ambulatory Visit: Payer: BC Managed Care – PPO

## 2021-01-02 ENCOUNTER — Ambulatory Visit: Payer: BC Managed Care – PPO | Admitting: Oncology

## 2021-01-11 ENCOUNTER — Other Ambulatory Visit: Payer: Self-pay

## 2021-01-11 ENCOUNTER — Inpatient Hospital Stay: Payer: BC Managed Care – PPO | Admitting: Oncology

## 2021-01-11 ENCOUNTER — Inpatient Hospital Stay: Payer: BC Managed Care – PPO | Attending: Oncology

## 2021-01-11 VITALS — BP 113/63 | HR 44 | Temp 98.1°F | Resp 18 | Ht 67.0 in | Wt 154.4 lb

## 2021-01-11 DIAGNOSIS — C6292 Malignant neoplasm of left testis, unspecified whether descended or undescended: Secondary | ICD-10-CM

## 2021-01-11 DIAGNOSIS — Z880 Allergy status to penicillin: Secondary | ICD-10-CM | POA: Insufficient documentation

## 2021-01-11 DIAGNOSIS — Z79899 Other long term (current) drug therapy: Secondary | ICD-10-CM | POA: Diagnosis not present

## 2021-01-11 DIAGNOSIS — C6291 Malignant neoplasm of right testis, unspecified whether descended or undescended: Secondary | ICD-10-CM | POA: Diagnosis present

## 2021-01-11 DIAGNOSIS — D696 Thrombocytopenia, unspecified: Secondary | ICD-10-CM | POA: Insufficient documentation

## 2021-01-11 DIAGNOSIS — R1310 Dysphagia, unspecified: Secondary | ICD-10-CM | POA: Insufficient documentation

## 2021-01-11 LAB — CBC WITH DIFFERENTIAL (CANCER CENTER ONLY)
Abs Immature Granulocytes: 0.03 10*3/uL (ref 0.00–0.07)
Basophils Absolute: 0.1 10*3/uL (ref 0.0–0.1)
Basophils Relative: 1 %
Eosinophils Absolute: 0.2 10*3/uL (ref 0.0–0.5)
Eosinophils Relative: 3 %
HCT: 46.2 % (ref 39.0–52.0)
Hemoglobin: 15.1 g/dL (ref 13.0–17.0)
Immature Granulocytes: 0 %
Lymphocytes Relative: 31 %
Lymphs Abs: 2.1 10*3/uL (ref 0.7–4.0)
MCH: 29.4 pg (ref 26.0–34.0)
MCHC: 32.7 g/dL (ref 30.0–36.0)
MCV: 90.1 fL (ref 80.0–100.0)
Monocytes Absolute: 0.7 10*3/uL (ref 0.1–1.0)
Monocytes Relative: 11 %
Neutro Abs: 3.6 10*3/uL (ref 1.7–7.7)
Neutrophils Relative %: 54 %
Platelet Count: 140 10*3/uL — ABNORMAL LOW (ref 150–400)
RBC: 5.13 MIL/uL (ref 4.22–5.81)
RDW: 13.4 % (ref 11.5–15.5)
WBC Count: 6.7 10*3/uL (ref 4.0–10.5)
nRBC: 0 % (ref 0.0–0.2)

## 2021-01-11 LAB — CMP (CANCER CENTER ONLY)
ALT: 19 U/L (ref 0–44)
AST: 19 U/L (ref 15–41)
Albumin: 3.8 g/dL (ref 3.5–5.0)
Alkaline Phosphatase: 61 U/L (ref 38–126)
Anion gap: 8 (ref 5–15)
BUN: 11 mg/dL (ref 6–20)
CO2: 28 mmol/L (ref 22–32)
Calcium: 9.2 mg/dL (ref 8.9–10.3)
Chloride: 107 mmol/L (ref 98–111)
Creatinine: 1.15 mg/dL (ref 0.61–1.24)
GFR, Estimated: >60 mL/min (ref >60)
Glucose, Bld: 96 mg/dL (ref 70–99)
Potassium: 4.8 mmol/L (ref 3.5–5.1)
Sodium: 143 mmol/L (ref 135–145)
Total Bilirubin: 0.5 mg/dL (ref 0.3–1.2)
Total Protein: 6.5 g/dL (ref 6.5–8.1)

## 2021-01-11 LAB — LACTATE DEHYDROGENASE: LDH: 129 U/L (ref 98–192)

## 2021-01-11 NOTE — Progress Notes (Signed)
Hematology and Oncology Follow Up Visit  TAM PIANA OM:[id number] 1966/05/17 55 y.o. 01/11/2021 9:16 AM Patient, No Pcp Per (Inactive)No ref. provider found   Principle Diagnosis: 55 year old man with T2N0 right testicular seminoma diagnosed in 2016.   Prior Therapy:  He is status post right orchiectomy done on 08/01/2014. 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  Carboplatin adjuvant chemotherapy given at AUC of 7 first dose given on 09/22/2014. The second dose was given on 10/13/2014.   Current therapy: Active surveillance.  Interim History: Mr. Laitinen is here for return evaluation.  Since last visit, he reports no major changes in his health.  He does report symptoms of intermittent dysphagia which has been chronic in nature.  Has been evaluated by ENT on multiple occasions including fiberoptic laryngoscopy in August 2021.  No evidence of malignancy detected.  Otherwise no complaints at this time he has limited his exercise because of knee and back injury but still able to golf at this time.  Denies any recent hospitalization or illnesses.       Medications: Reviewed without changes. Current Outpatient Medications  Medication Sig Dispense Refill   finasteride (PROPECIA) 1 MG tablet Take 1 mg by mouth daily.     OXcarbazepine (TRILEPTAL) 150 MG tablet Take 150 mg by mouth 2 (two) times daily.     traZODone (DESYREL) 100 MG tablet Take 100 mg by mouth at bedtime.     VIIBRYD 20 MG TABS Take 1 tablet by mouth daily.     No current facility-administered medications for this visit.     Allergies:  Allergies  Allergen Reactions   Penicillins Hives     Physical Exam:   Blood pressure 113/63, pulse (!) 44, temperature 98.1 F (36.7 C), temperature source Oral, resp. rate 18, height '5\' 7"'$  (1.702 m), weight 154 lb 6.4 oz (70 kg), SpO2 100 %.    ECOG: 0     General appearance: Comfortable appearing without any discomfort Head: Normocephalic  without any trauma Oropharynx: Mucous membranes are moist and pink without any thrush or ulcers. Eyes: Pupils are equal and round reactive to light. Lymph nodes: No cervical, supraclavicular, inguinal or axillary lymphadenopathy.   Heart:regular rate and rhythm.  S1 and S2 without leg edema. Lung: Clear without any rhonchi or wheezes.  No dullness to percussion. Abdomin: Soft, nontender, nondistended with good bowel sounds.  No hepatosplenomegaly. Musculoskeletal: No joint deformity or effusion.  Full range of motion noted. Neurological: No deficits noted on motor, sensory and deep tendon reflex exam. Skin: No petechial rash or dryness.  Appeared moist.         Lab Results: Lab Results  Component Value Date   WBC 6.3 07/25/2020   HGB 15.3 07/25/2020   HCT 46.8 07/25/2020   MCV 89.5 07/25/2020   PLT 144 (L) 07/25/2020     Chemistry      Component Value Date/Time   NA 140 07/25/2020 0854   NA 139 12/19/2016 0814   K 4.3 07/25/2020 0854   K 4.3 12/19/2016 0814   CL 106 07/25/2020 0854   CO2 25 07/25/2020 0854   CO2 26 12/19/2016 0814   BUN 16 07/25/2020 0854   BUN 10.5 12/19/2016 0814   CREATININE 1.22 07/25/2020 0854   CREATININE 1.1 12/19/2016 0814      Component Value Date/Time   CALCIUM 9.1 07/25/2020 0854   CALCIUM 9.6 12/19/2016 0814   ALKPHOS 61 07/25/2020 0854   ALKPHOS 71 12/19/2016 0814  AST 20 07/25/2020 0854   AST 22 12/19/2016 0814   ALT 22 07/25/2020 0854   ALT 26 12/19/2016 0814   BILITOT 0.7 07/25/2020 0854   BILITOT 0.73 12/19/2016 0814              Impression and Plan:    55 year old man with:  1.  T2N0 seminoma of the right testicle diagnosed in 2016.  He was found to haveT2N0 disease.  His disease status was updated at this time and treatment choices were reviewed.  Imaging studies obtained in March 2022 did not show any evidence of metastatic disease.  At this time the risk of relapse is very low as he is over 5 years out from his  diagnosis.  Annual physical examination and laboratory testing still recommended given the risk of relapse persists longer than 5 years.  He is agreeable to continue at this time.   2 Thrombocytopenia: Mild and asymptomatic at this point.  We will continue to monitor.  3.  Age-appropriate health maintenance: He is up-to-date at this time which I recommended continuing.  4.  Follow-up: In 1 year for repeat follow-up.   30  minutes were dedicated to this visit.  Time spent on reviewing laboratory data, disease status update and outlining future plan of care.   Zola Button, MD 8/19/20229:16 AM

## 2021-01-12 LAB — BETA HCG QUANT (REF LAB): hCG Quant: <1 m[IU]/mL (ref 0–3)

## 2021-01-12 LAB — AFP TUMOR MARKER: AFP, Serum, Tumor Marker: 3 ng/mL (ref 0.0–8.4)

## 2021-01-14 ENCOUNTER — Telehealth: Payer: Self-pay | Admitting: Oncology

## 2021-01-14 NOTE — Telephone Encounter (Signed)
Scheduled per 08/19 los, patient has been called and notified.

## 2021-07-29 ENCOUNTER — Other Ambulatory Visit: Payer: Self-pay | Admitting: Oncology

## 2021-07-29 ENCOUNTER — Telehealth: Payer: Self-pay | Admitting: *Deleted

## 2021-07-29 DIAGNOSIS — C6292 Malignant neoplasm of left testis, unspecified whether descended or undescended: Secondary | ICD-10-CM

## 2021-07-29 NOTE — Telephone Encounter (Signed)
Aman states he has "not been feeling well X 2 months", having lower abdominal discomfort and says the lump on his back is getting bigger. Is concerned and would like to be seen soon. ?

## 2021-07-29 NOTE — Telephone Encounter (Signed)
Notified that CT has been ordered. Dr Alen Blew will follow up once complete ?

## 2021-08-02 ENCOUNTER — Other Ambulatory Visit: Payer: Self-pay

## 2021-08-02 ENCOUNTER — Ambulatory Visit (HOSPITAL_COMMUNITY)
Admission: RE | Admit: 2021-08-02 | Discharge: 2021-08-02 | Disposition: A | Discharge status: Home / Self Care | Payer: BC Managed Care – PPO | Source: Ambulatory Visit | Attending: Oncology | Admitting: Oncology

## 2021-08-02 DIAGNOSIS — C6292 Malignant neoplasm of left testis, unspecified whether descended or undescended: Secondary | ICD-10-CM | POA: Diagnosis present

## 2021-08-02 MED ORDER — IOHEXOL 300 MG/ML  SOLN
100.0000 mL | Freq: Once | INTRAMUSCULAR | Status: AC | PRN
  Administered 2021-08-02: 100 mL via INTRAVENOUS

## 2021-08-05 ENCOUNTER — Telehealth: Payer: Self-pay

## 2021-08-05 NOTE — Telephone Encounter (Signed)
Called patient to advise that CT scan showed no cancer but did show constipation. Educated on use of Miralax and Senna. ?

## 2021-09-06 ENCOUNTER — Other Ambulatory Visit: Payer: Self-pay

## 2021-09-06 ENCOUNTER — Inpatient Hospital Stay: Payer: BC Managed Care – PPO | Admitting: Oncology

## 2021-09-06 ENCOUNTER — Telehealth: Payer: Self-pay | Admitting: Oncology

## 2021-09-06 ENCOUNTER — Inpatient Hospital Stay: Payer: BC Managed Care – PPO | Attending: Oncology

## 2021-09-06 DIAGNOSIS — D6959 Other secondary thrombocytopenia: Secondary | ICD-10-CM | POA: Diagnosis not present

## 2021-09-06 DIAGNOSIS — C6292 Malignant neoplasm of left testis, unspecified whether descended or undescended: Secondary | ICD-10-CM

## 2021-09-06 DIAGNOSIS — K5900 Constipation, unspecified: Secondary | ICD-10-CM | POA: Diagnosis not present

## 2021-09-06 DIAGNOSIS — Z79899 Other long term (current) drug therapy: Secondary | ICD-10-CM | POA: Insufficient documentation

## 2021-09-06 DIAGNOSIS — D1803 Hemangioma of intra-abdominal structures: Secondary | ICD-10-CM | POA: Insufficient documentation

## 2021-09-06 DIAGNOSIS — M2550 Pain in unspecified joint: Secondary | ICD-10-CM | POA: Insufficient documentation

## 2021-09-06 DIAGNOSIS — Z880 Allergy status to penicillin: Secondary | ICD-10-CM | POA: Diagnosis not present

## 2021-09-06 DIAGNOSIS — I700 Atherosclerosis of aorta: Secondary | ICD-10-CM | POA: Diagnosis not present

## 2021-09-06 DIAGNOSIS — T451X5A Adverse effect of antineoplastic and immunosuppressive drugs, initial encounter: Secondary | ICD-10-CM | POA: Diagnosis not present

## 2021-09-06 DIAGNOSIS — C6291 Malignant neoplasm of right testis, unspecified whether descended or undescended: Secondary | ICD-10-CM | POA: Insufficient documentation

## 2021-09-06 LAB — CBC WITH DIFFERENTIAL (CANCER CENTER ONLY)
Abs Immature Granulocytes: 0.02 10*3/uL (ref 0.00–0.07)
Basophils Absolute: 0.1 10*3/uL (ref 0.0–0.1)
Basophils Relative: 1 %
Eosinophils Absolute: 0.2 10*3/uL (ref 0.0–0.5)
Eosinophils Relative: 4 %
HCT: 46 % (ref 39.0–52.0)
Hemoglobin: 15.1 g/dL (ref 13.0–17.0)
Immature Granulocytes: 0 %
Lymphocytes Relative: 31 %
Lymphs Abs: 2.1 10*3/uL (ref 0.7–4.0)
MCH: 29.4 pg (ref 26.0–34.0)
MCHC: 32.8 g/dL (ref 30.0–36.0)
MCV: 89.5 fL (ref 80.0–100.0)
Monocytes Absolute: 0.7 10*3/uL (ref 0.1–1.0)
Monocytes Relative: 10 %
Neutro Abs: 3.6 10*3/uL (ref 1.7–7.7)
Neutrophils Relative %: 54 %
Platelet Count: 152 10*3/uL (ref 150–400)
RBC: 5.14 MIL/uL (ref 4.22–5.81)
RDW: 12.8 % (ref 11.5–15.5)
WBC Count: 6.7 10*3/uL (ref 4.0–10.5)
nRBC: 0 % (ref 0.0–0.2)

## 2021-09-06 LAB — CMP (CANCER CENTER ONLY)
ALT: 18 U/L (ref 0–44)
AST: 18 U/L (ref 15–41)
Albumin: 4.2 g/dL (ref 3.5–5.0)
Alkaline Phosphatase: 61 U/L (ref 38–126)
Anion gap: 4 — ABNORMAL LOW (ref 5–15)
BUN: 11 mg/dL (ref 6–20)
CO2: 32 mmol/L (ref 22–32)
Calcium: 9.3 mg/dL (ref 8.9–10.3)
Chloride: 104 mmol/L (ref 98–111)
Creatinine: 1.25 mg/dL — ABNORMAL HIGH (ref 0.61–1.24)
GFR, Estimated: >60 mL/min (ref >60)
Glucose, Bld: 96 mg/dL (ref 70–99)
Potassium: 4.6 mmol/L (ref 3.5–5.1)
Sodium: 140 mmol/L (ref 135–145)
Total Bilirubin: 0.7 mg/dL (ref 0.3–1.2)
Total Protein: 7 g/dL (ref 6.5–8.1)

## 2021-09-06 LAB — LACTATE DEHYDROGENASE: LDH: 111 U/L (ref 98–192)

## 2021-09-06 NOTE — Progress Notes (Signed)
Hematology and Oncology Follow Up Visit ? ?[name] ?[id number] ?11/12/1965 56 y.o. ?09/06/2021 8:10 AM ?Patient, No Pcp Per (Inactive)No ref. provider found  ? ?Principle Diagnosis: 56 year old man with right testicular seminoma diagnosed in 2016.  He was found to have T2N0 tumor. ? ?Prior Therapy:  ?He is status post right orchiectomy done on 08/01/2014. 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 ? ?Carboplatin adjuvant chemotherapy given at AUC of 7 first dose given on 09/22/2014. The second dose was given on 10/13/2014. ? ? ?Current therapy: Active surveillance. ? ?Interim History: Mr. Gains returns today for a follow-up visit.  Since the last visit, he reports no major changes in his health.  He has reported a few complaints including arthralgias and lower back discomfort.  His performance status quality of life remains unchanged.  His appetite is reasonable and have gained weight. ? ? ? ? ? ? ?Medications: updated on review. ?Current Outpatient Medications  ?Medication Sig Dispense Refill  ? finasteride (PROPECIA) 1 MG tablet Take 1 mg by mouth daily.    ? OXcarbazepine (TRILEPTAL) 150 MG tablet Take 150 mg by mouth 2 (two) times daily.    ? traZODone (DESYREL) 100 MG tablet Take 100 mg by mouth at bedtime.    ? VIIBRYD 20 MG TABS Take 1 tablet by mouth daily.    ? ?No current facility-administered medications for this visit.  ? ? ? ?Allergies:  ?Allergies  ?Allergen Reactions  ? Penicillins Hives  ? ? ? ?Physical Exam: ? ? ? ? ? ? ? ?ECOG: 0  ? ? ?General appearance: Alert, awake without any distress. ?Head: Atraumatic without abnormalities ?Oropharynx: Without any thrush or ulcers. ?Eyes: No scleral icterus. ?Lymph nodes: No lymphadenopathy noted in the cervical, supraclavicular, or axillary nodes ?Heart:regular rate and rhythm, without any murmurs or gallops.   ?Lung: Clear to auscultation without any rhonchi, wheezes or dullness to percussion. ?Abdomin: Soft, nontender without  any shifting dullness or ascites. ?Musculoskeletal: No clubbing or cyanosis. ?Neurological: No motor or sensory deficits. ?Skin: No rashes or lesions. ? ? ? ? ? ? ? ? ?Lab Results: ?Lab Results  ?Component Value Date  ? WBC 6.7 01/11/2021  ? HGB 15.1 01/11/2021  ? HCT 46.2 01/11/2021  ? MCV 90.1 01/11/2021  ? PLT 140 (L) 01/11/2021  ? ?  Chemistry   ?   ?Component Value Date/Time  ? NA 143 01/11/2021 0913  ? NA 139 12/19/2016 0814  ? K 4.8 01/11/2021 0913  ? K 4.3 12/19/2016 0814  ? CL 107 01/11/2021 0913  ? CO2 28 01/11/2021 0913  ? CO2 26 12/19/2016 0814  ? BUN 11 01/11/2021 0913  ? BUN 10.5 12/19/2016 0814  ? CREATININE 1.15 01/11/2021 0913  ? CREATININE 1.1 12/19/2016 0814  ?    ?Component Value Date/Time  ? CALCIUM 9.2 01/11/2021 0913  ? CALCIUM 9.6 12/19/2016 0814  ? ALKPHOS 61 01/11/2021 0913  ? ALKPHOS 71 12/19/2016 0814  ? AST 19 01/11/2021 0913  ? AST 22 12/19/2016 0814  ? ALT 19 01/11/2021 0913  ? ALT 26 12/19/2016 0814  ? BILITOT 0.5 01/11/2021 0913  ? BILITOT 0.73 12/19/2016 0814  ?  ? ?  ?  ?  ? IMPRESSION: ?1. No evidence of metastatic disease within the abdomen or pelvis. ?2. Moderate volume of formed stool throughout the colon suggestive ?of constipation. ?3. Stable hepatic hemangiomas. ?4. Aortic Atherosclerosis (ICD10-I70.0). ?  ? ?Impression and Plan: ? ? ? ?56 year old man  with: ? ?1.  Seminoma of the right testicle diagnosed in 2016.  He was found to have T2N0 disease at that time. ? ?The natural course of this disease was reviewed at this time and risk of relapse was assessed.  CT scan obtained on August 02, 2021 was personally reviewed and showed no evidence of malignancy.  At this time I recommended annual surveillance with repeat imaging studies only as needed.  He is agreeable at this time. ? ? 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  His platelet count has recovered at this time to normal range. ? ?3.  Age-appropriate health maintenance: He continues to be  up-to-date on his age-appropriate screening. ? ?4.  Lower back discomfort: Appears to be chronic and unrelated to malignancy.  He imaging studies did not show any abnormalities. ? ?5.  Follow-up: In 4 months for repeat follow-up. ? ? ?30  minutes were spent on this encounter.  The time was dedicated to reviewing laboratory data, disease status update, reviewing imaging studies and future plan of care discussion. ? ? ?Zola Button, MD ?4/14/20238:10 AM ?

## 2021-09-06 NOTE — Telephone Encounter (Signed)
Per 4/14 los [place] and spoke to pt.  Pt confirmed appointments  ?

## 2021-09-07 LAB — AFP TUMOR MARKER: AFP, Serum, Tumor Marker: 3.2 ng/mL (ref 0.0–8.4)

## 2021-09-07 LAB — BETA HCG QUANT (REF LAB): hCG Quant: <1 m[IU]/mL (ref 0–3)

## 2022-01-10 ENCOUNTER — Ambulatory Visit: Payer: BC Managed Care – PPO | Admitting: Oncology

## 2022-01-10 ENCOUNTER — Other Ambulatory Visit: Payer: BC Managed Care – PPO

## 2022-04-15 ENCOUNTER — Telehealth: Payer: Self-pay | Admitting: Oncology

## 2022-04-15 NOTE — Telephone Encounter (Signed)
Called patient per shadad transition. Patient r/s with Dr. Alvy Bimler

## 2022-04-18 IMAGING — CT CT ABD-PELV W/ CM
2 of 6 series · 15 of 46 positions shown, 17 images · IV contrast (agent unspecified)
Comparison: Multiple priors including most recent CT August 19, 2020

CLINICAL DATA: History of testicular cancer, seminoma,
restaging/follow-up.

EXAM:
CT ABDOMEN AND PELVIS WITH CONTRAST
TECHNIQUE: Multidetector CT imaging of the abdomen and pelvis was performed
using the standard protocol following bolus administration of
intravenous contrast.

[Series 2: axial st · axial · 0.93mm/px · z∈[-641,-226]mm · 12 of 97 slices shown, 14 images]
[im 7/97  soft-tissue]
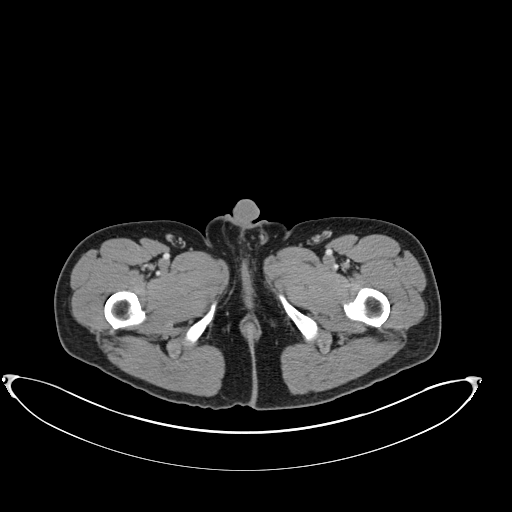
[im 7/97  bone]
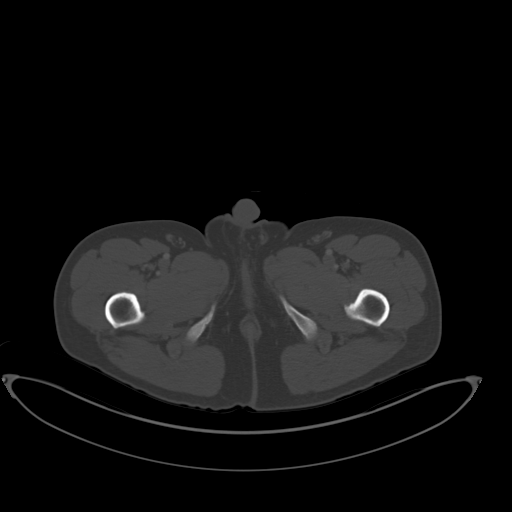
[im 14/97  soft-tissue]
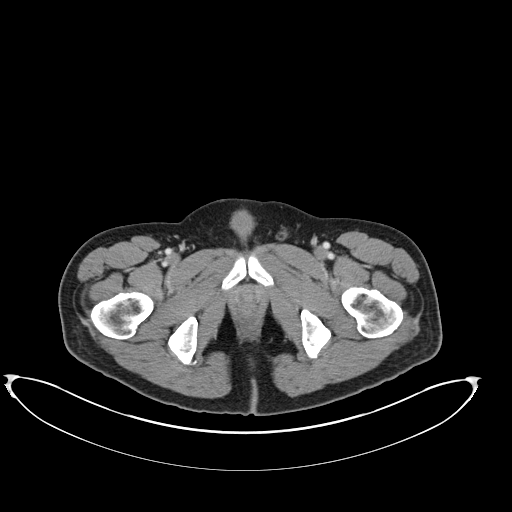
[im 21/97  soft-tissue]
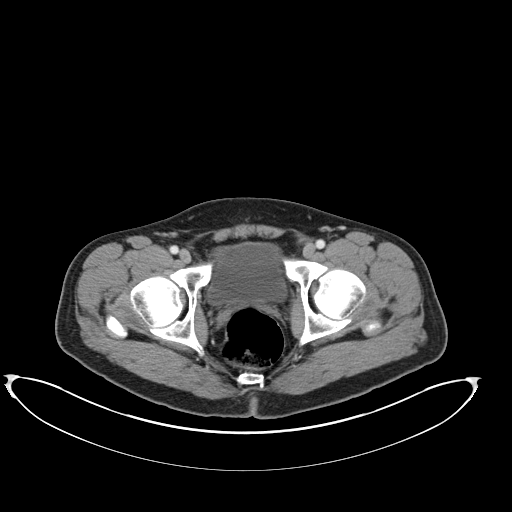
[im 28/97  soft-tissue]
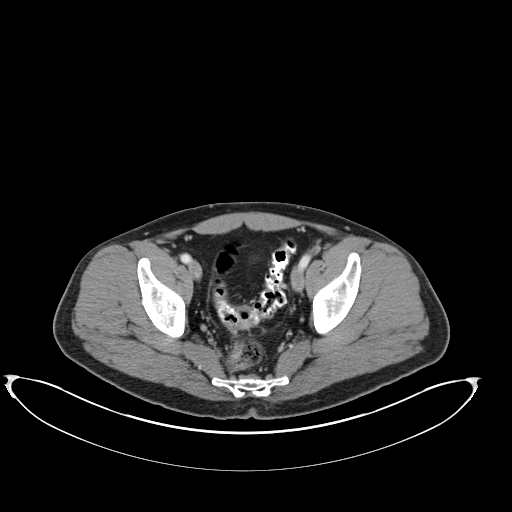
[im 35/97  soft-tissue]
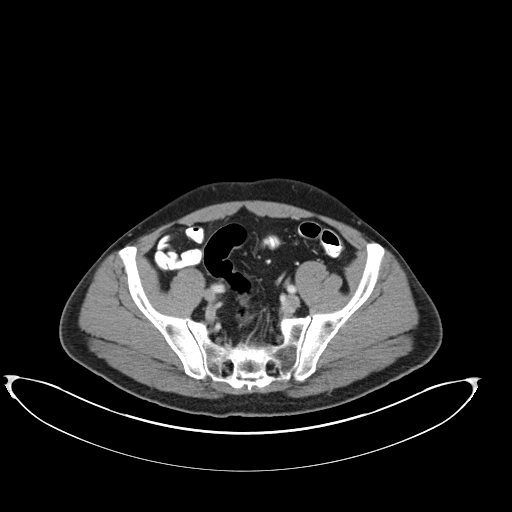
[im 42/97  soft-tissue]
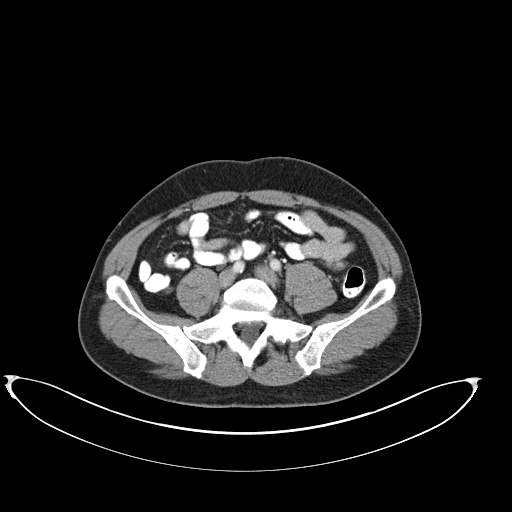
[im 55/97  soft-tissue]
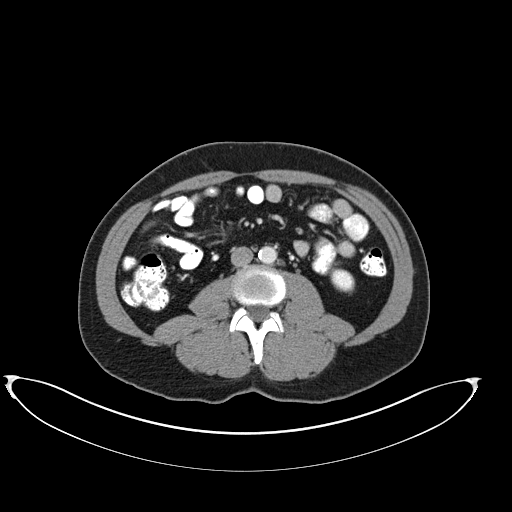
[im 62/97  soft-tissue]
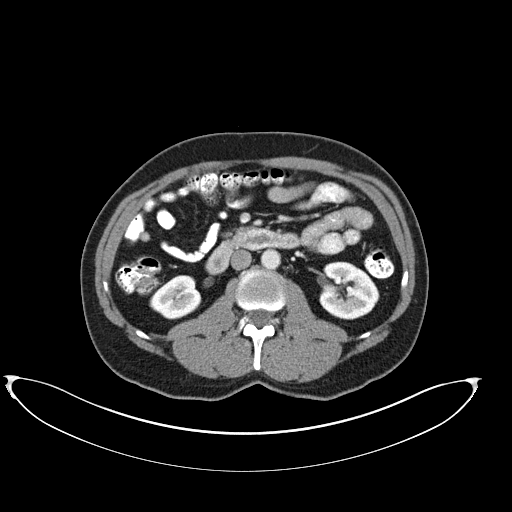
[im 69/97  soft-tissue]
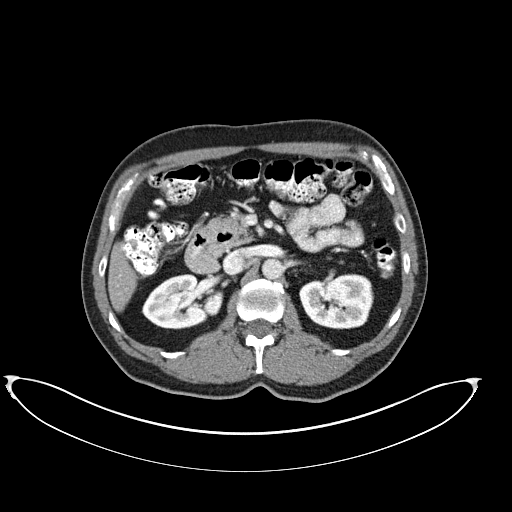
[im 69/97  bone]
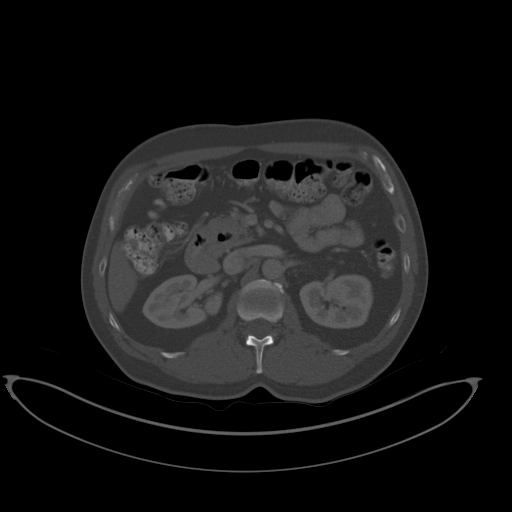
[im 76/97  soft-tissue]
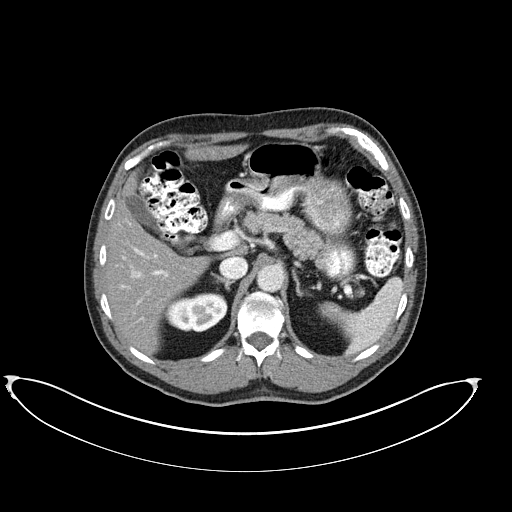
[im 83/97  soft-tissue]
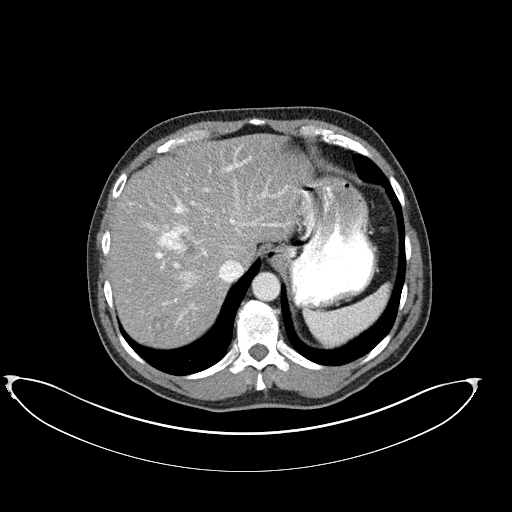
[im 90/97  soft-tissue]
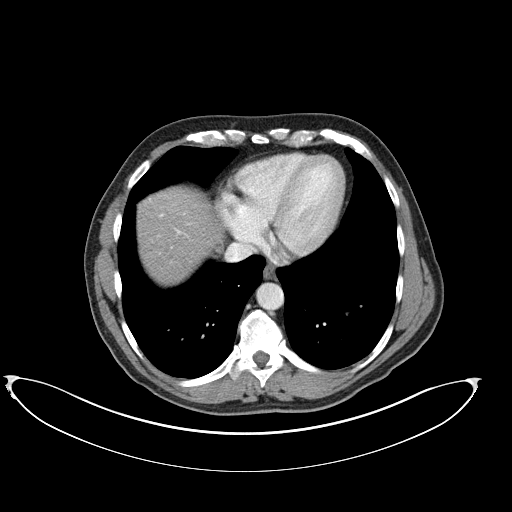

[Series 4: coronal st · coronal · 0.78mm/px · 3 of 151 slices shown]
[im 51/151  soft-tissue]
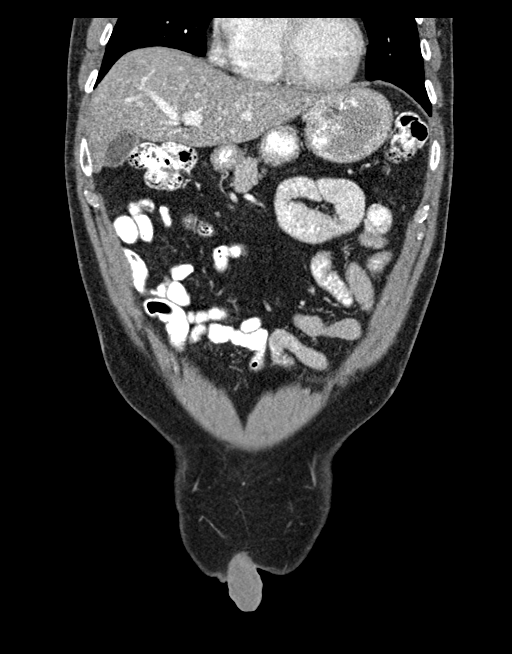
[im 67/151  soft-tissue]
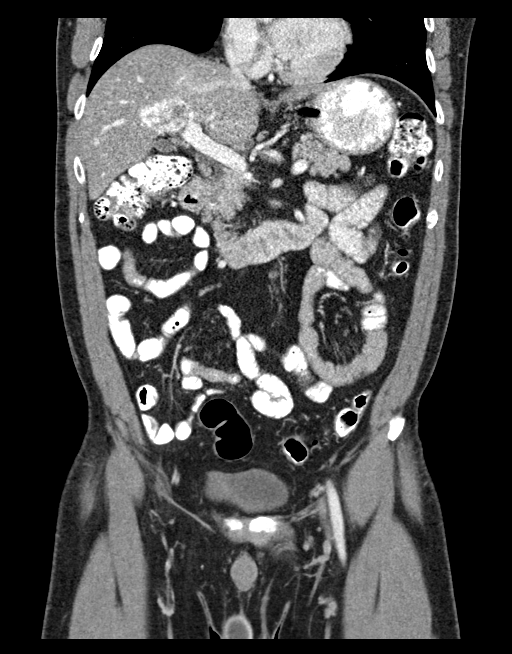
[im 84/151  soft-tissue]
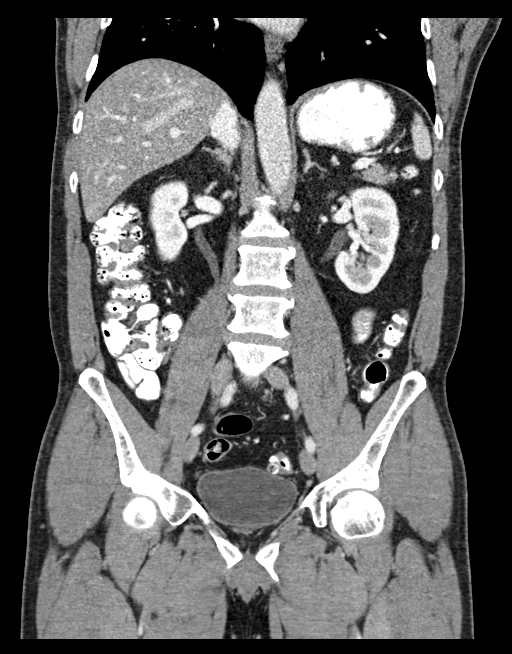

[15 of 46 positions shown; findings below may reference images not displayed]

RADIATION DOSE REDUCTION: This exam was performed according to the
departmental dose-optimization program which includes automated
exposure control, adjustment of the mA and/or kV according to
patient size and/or use of iterative reconstruction technique.

CONTRAST:  100mL OMNIPAQUE IOHEXOL 300 MG/ML  SOLN
FINDINGS: Lower chest: No acute abnormality.

Hepatobiliary: Heterogeneously enhancing segment VIII hepatic
hemangioma measures 3.5 cm on image [DATE] previously 3.6 cm. Stable
19 mm hemangioma in the posterior right lobe of the liver on image
[DATE]. Enhancing 14 mm focus in the dome of the liver is slightly
more conspicuous on today's examination on image [DATE] but has
demonstrate a waxing and waning appearance over multiple studies and
is also most consistent with a benign hepatic hemangioma.
Gallbladder is unremarkable. No biliary ductal dilation.

Pancreas: No pancreatic ductal dilation or evidence of acute
inflammation.

Spleen: No splenomegaly or focal splenic lesion.

Adrenals/Urinary Tract: Bilateral adrenal glands appear normal. No
hydronephrosis. Tiny hypodensities in the right kidney are
technically too small to accurately characterize but statistically
likely reflect cysts. Left kidney is unremarkable. Urinary bladder
is unremarkable for degree of distension.

Stomach/Bowel: Radiopaque enteric contrast material traverses the
descending colon. Stomach is unremarkable for degree of distension.
Periampullary duodenal diverticulum. No pathologic dilation of large
or small bowel. The appendix and terminal ileum appear normal.
Moderate volume of formed stool throughout the colon suggestive of
constipation.

Vascular/Lymphatic: Aortic atherosclerosis without aneurysmal
dilation of the aorta. No pathologically enlarged abdominal or
pelvic lymph nodes.

Reproductive: Prostate gland and seminal vesicles appear normal.
Testicular prosthesis noted.

Other: No significant abdominopelvic free fluid.

Musculoskeletal: No aggressive lytic or blastic lesion of bone.
Multilevel degenerative changes spine.
IMPRESSION: 1. No evidence of metastatic disease within the abdomen or pelvis.
2. Moderate volume of formed stool throughout the colon suggestive
of constipation.
3. Stable hepatic hemangiomas.
4. Aortic Atherosclerosis ([id number]).

## 2022-05-25 ENCOUNTER — Emergency Department (HOSPITAL_BASED_OUTPATIENT_CLINIC_OR_DEPARTMENT_OTHER): Payer: BC Managed Care – PPO | Admitting: Radiology

## 2022-05-25 ENCOUNTER — Other Ambulatory Visit: Payer: Self-pay

## 2022-05-25 ENCOUNTER — Encounter (HOSPITAL_BASED_OUTPATIENT_CLINIC_OR_DEPARTMENT_OTHER): Payer: Self-pay | Admitting: Emergency Medicine

## 2022-05-25 ENCOUNTER — Emergency Department (HOSPITAL_BASED_OUTPATIENT_CLINIC_OR_DEPARTMENT_OTHER)
Admission: EM | Admit: 2022-05-25 | Discharge: 2022-05-25 | Disposition: A | Discharge status: Home / Self Care | Payer: BC Managed Care – PPO | Attending: Emergency Medicine | Admitting: Emergency Medicine

## 2022-05-25 DIAGNOSIS — R1319 Other dysphagia: Secondary | ICD-10-CM

## 2022-05-25 DIAGNOSIS — R1314 Dysphagia, pharyngoesophageal phase: Secondary | ICD-10-CM | POA: Insufficient documentation

## 2022-05-25 DIAGNOSIS — R079 Chest pain, unspecified: Secondary | ICD-10-CM

## 2022-05-25 LAB — CBC
HCT: 46.9 % (ref 39.0–52.0)
Hemoglobin: 15.9 g/dL (ref 13.0–17.0)
MCH: 29.9 pg (ref 26.0–34.0)
MCHC: 33.9 g/dL (ref 30.0–36.0)
MCV: 88.3 fL (ref 80.0–100.0)
Platelets: 127 10*3/uL — ABNORMAL LOW (ref 150–400)
RBC: 5.31 MIL/uL (ref 4.22–5.81)
RDW: 13.6 % (ref 11.5–15.5)
WBC: 7.2 10*3/uL (ref 4.0–10.5)
nRBC: 0 % (ref 0.0–0.2)

## 2022-05-25 LAB — TROPONIN I (HIGH SENSITIVITY)
Troponin I (High Sensitivity): 3 ng/L (ref <18)
Troponin I (High Sensitivity): 3 ng/L (ref <18)

## 2022-05-25 LAB — BASIC METABOLIC PANEL
Anion gap: 11 (ref 5–15)
BUN: 13 mg/dL (ref 6–20)
CO2: 27 mmol/L (ref 22–32)
Calcium: 9.5 mg/dL (ref 8.9–10.3)
Chloride: 103 mmol/L (ref 98–111)
Creatinine, Ser: 1.1 mg/dL (ref 0.61–1.24)
GFR, Estimated: >60 mL/min (ref >60)
Glucose, Bld: 196 mg/dL — ABNORMAL HIGH (ref 70–99)
Potassium: 3.6 mmol/L (ref 3.5–5.1)
Sodium: 141 mmol/L (ref 135–145)

## 2022-05-25 LAB — D-DIMER, QUANTITATIVE: D-Dimer, Quant: <0.27 ug/mL-FEU (ref 0.00–0.50)

## 2022-05-25 MED ORDER — PANTOPRAZOLE SODIUM 20 MG PO TBEC: 40.0000 mg | DELAYED_RELEASE_TABLET | Freq: Every day | ORAL | 0 refills | Status: DC

## 2022-05-25 NOTE — ED Provider Notes (Signed)
[facility]ERGENC[facility] DEPT Provider Note   CSN: [id number] Arrival date & time: 05/25/22  9735     History {Add pertinent medical, surgical, social history, OB history to HPI:1} Chief Complaint  Patient presents with   Chest Pain    [name] is a 56 y.o. male.  HPI       Cp left and weird feeling of obstruction in esophagus when drink like first few sips don't go down and then if keep drinking it feels like it pushes to the side then allows the liquid to go through. More so liquid than food. Food goes down ok.  Liquid seems to be a problem.   The swallowing thing used to be higher, comes and goes, had swallow studies and ct completed without clear etiology, thought it was tumor in throat, then CT results were fine.  Thinks muscular thing with swallowing, never has been this low before. Now is that sensatoin lower and also pain.  CP started 4 days ago. Like cramping, fairly constant over the last 4 days.  Intensity waxes and wanes but constantly hurting.  Has  not been worse with exertion with minimal exertion in past week.  Not worse with deep breaths or change in position, but typically it might be.  Sitting up worse.  Eating or drinking does not change the left sided chest pain.  Not shortness of breath, no nausea or vomiting    No htn, hlpd, dm No fam hx of early CAD No smoking since September. Etoh, drinks a case a beer a week, no other drugs  Past Medical History:  Diagnosis Date   Headache    tension h/a's   Heart murmur    Sleep apnea    no cpap, unable to tolerate mask   testicular ca dx'd 07/2014   right   Testicular mass    rt sided   Weakness    arms and legs   Wears glasses    for computer work   4  Home Medications Prior to Admission medications   Medication Sig Start Date End Date Taking? Authorizing Provider  finasteride (PROPECIA) 1 MG tablet Take 1 mg by mouth daily.    [provider]  OXcarbazepine (TRILEPTAL) 150 MG  tablet Take 150 mg by mouth 2 (two) times daily. 12/12/19   [provider]  traZODone (DESYREL) 100 MG tablet Take 100 mg by mouth at bedtime.    [provider]  VIIBRYD 20 MG TABS Take 1 tablet by mouth daily. 11/21/19   [provider]      Allergies    Penicillins    Review of Systems   Review of Systems  Physical Exam Updated Vital Signs BP 123/67   Pulse 62   Temp 97.8 F (36.6 C)   Resp 14   Ht '5\' 7"'$  (1.702 m)   Wt 71.7 kg   SpO2 96%   BMI 24.75 kg/m  Physical Exam  ED Results / Procedures / Treatments   Labs (all labs ordered are listed, but only abnormal results are displayed) Labs Reviewed  BASIC METABOLIC PANEL - Abnormal; Notable for the following components:      Result Value   Glucose, Bld 196 (*)    All other components within normal limits  CBC - Abnormal; Notable for the following components:   Platelets 127 (*)    All other components within normal limits  TROPONIN I (HIGH SENSITIVITY)    EKG None  Radiology DG Chest  2 View  Result Date: 05/25/2022 CLINICAL DATA:  56 year old male with history of chest pain. EXAM: CHEST - 2 VIEW COMPARISON:  Chest x-ray 05/06/2017. FINDINGS: Lung volumes are normal. No consolidative airspace disease. No pleural effusions. No pneumothorax. No pulmonary nodule or mass noted. Pulmonary vasculature and the cardiomediastinal silhouette are within normal limits. Orthopedic fixation hardware in the lower cervical spine incidentally noted. IMPRESSION: No radiographic evidence of acute cardiopulmonary disease. Electronically Signed   By: [name] M.D.   On: 05/25/2022 10:17    Procedures Procedures  {Document cardiac monitor, telemetry assessment procedure when appropriate:1}  Medications Ordered in ED Medications - No data to display  ED Course/ Medical Decision Making/ A&P                           Medical Decision Making Amount and/or Complexity of Data Reviewed Labs:  ordered. Radiology: ordered.   ***  {Document critical care time when appropriate:1} {Document review of labs and clinical decision tools ie heart score, Chads2Vasc2 etc:1}  {Document your independent review of radiology images, and any outside records:1} {Document your discussion with family members, caretakers, and with consultants:1} {Document social determinants of health affecting pt's care:1} {Document your decision making why or why not admission, treatments were needed:1} Final Clinical Impression(s) / ED Diagnoses Final diagnoses:  None    Rx / DC Orders ED Discharge Orders     None

## 2022-05-25 NOTE — ED Triage Notes (Signed)
Pt via pov from home with chest pain x 4-5 days; reports that he feels a sensation of having some sort of "blockage" in his lower esophagus that has made it difficult to swallow. He has seen ENT for the same (swallow study) with no clear diagnosis/resolution. Denies SOB with cp; no n/v/diaphoresis. Pt alert & oriented, nad noted.

## 2022-09-08 ENCOUNTER — Other Ambulatory Visit: Payer: Self-pay | Admitting: Hematology and Oncology

## 2022-09-08 DIAGNOSIS — C6292 Malignant neoplasm of left testis, unspecified whether descended or undescended: Secondary | ICD-10-CM

## 2022-09-09 ENCOUNTER — Inpatient Hospital Stay (HOSPITAL_BASED_OUTPATIENT_CLINIC_OR_DEPARTMENT_OTHER): Payer: BC Managed Care – PPO | Admitting: Hematology and Oncology

## 2022-09-09 ENCOUNTER — Inpatient Hospital Stay: Payer: BC Managed Care – PPO | Attending: Hematology and Oncology

## 2022-09-09 ENCOUNTER — Encounter: Payer: Self-pay | Admitting: Hematology and Oncology

## 2022-09-09 VITALS — BP 115/56 | HR 56 | Temp 98.0°F | Resp 18 | Ht 67.0 in | Wt 163.0 lb

## 2022-09-09 DIAGNOSIS — R140 Abdominal distension (gaseous): Secondary | ICD-10-CM

## 2022-09-09 DIAGNOSIS — C6292 Malignant neoplasm of left testis, unspecified whether descended or undescended: Secondary | ICD-10-CM

## 2022-09-09 DIAGNOSIS — R059 Cough, unspecified: Secondary | ICD-10-CM | POA: Insufficient documentation

## 2022-09-09 DIAGNOSIS — Z9079 Acquired absence of other genital organ(s): Secondary | ICD-10-CM | POA: Diagnosis not present

## 2022-09-09 DIAGNOSIS — R0602 Shortness of breath: Secondary | ICD-10-CM | POA: Insufficient documentation

## 2022-09-09 DIAGNOSIS — R0600 Dyspnea, unspecified: Secondary | ICD-10-CM | POA: Diagnosis not present

## 2022-09-09 DIAGNOSIS — K5900 Constipation, unspecified: Secondary | ICD-10-CM | POA: Diagnosis not present

## 2022-09-09 DIAGNOSIS — K760 Fatty (change of) liver, not elsewhere classified: Secondary | ICD-10-CM | POA: Diagnosis not present

## 2022-09-09 DIAGNOSIS — I700 Atherosclerosis of aorta: Secondary | ICD-10-CM | POA: Insufficient documentation

## 2022-09-09 DIAGNOSIS — Z8249 Family history of ischemic heart disease and other diseases of the circulatory system: Secondary | ICD-10-CM | POA: Insufficient documentation

## 2022-09-09 DIAGNOSIS — D696 Thrombocytopenia, unspecified: Secondary | ICD-10-CM | POA: Insufficient documentation

## 2022-09-09 DIAGNOSIS — R635 Abnormal weight gain: Secondary | ICD-10-CM | POA: Diagnosis not present

## 2022-09-09 DIAGNOSIS — R079 Chest pain, unspecified: Secondary | ICD-10-CM | POA: Diagnosis not present

## 2022-09-09 DIAGNOSIS — Z79899 Other long term (current) drug therapy: Secondary | ICD-10-CM | POA: Diagnosis not present

## 2022-09-09 DIAGNOSIS — Z880 Allergy status to penicillin: Secondary | ICD-10-CM | POA: Diagnosis not present

## 2022-09-09 DIAGNOSIS — K5730 Diverticulosis of large intestine without perforation or abscess without bleeding: Secondary | ICD-10-CM | POA: Diagnosis not present

## 2022-09-09 DIAGNOSIS — R194 Change in bowel habit: Secondary | ICD-10-CM | POA: Diagnosis not present

## 2022-09-09 LAB — CBC WITH DIFFERENTIAL (CANCER CENTER ONLY)
Abs Immature Granulocytes: 0.05 10*3/uL (ref 0.00–0.07)
Basophils Absolute: 0.1 10*3/uL (ref 0.0–0.1)
Basophils Relative: 1 %
Eosinophils Absolute: 0.2 10*3/uL (ref 0.0–0.5)
Eosinophils Relative: 3 %
HCT: 46.8 % (ref 39.0–52.0)
Hemoglobin: 15.9 g/dL (ref 13.0–17.0)
Immature Granulocytes: 1 %
Lymphocytes Relative: 23 %
Lymphs Abs: 1.8 10*3/uL (ref 0.7–4.0)
MCH: 30.3 pg (ref 26.0–34.0)
MCHC: 34 g/dL (ref 30.0–36.0)
MCV: 89.3 fL (ref 80.0–100.0)
Monocytes Absolute: 0.9 10*3/uL (ref 0.1–1.0)
Monocytes Relative: 12 %
Neutro Abs: 4.7 10*3/uL (ref 1.7–7.7)
Neutrophils Relative %: 60 %
Platelet Count: 142 10*3/uL — ABNORMAL LOW (ref 150–400)
RBC: 5.24 MIL/uL (ref 4.22–5.81)
RDW: 13.2 % (ref 11.5–15.5)
WBC Count: 7.8 10*3/uL (ref 4.0–10.5)
nRBC: 0 % (ref 0.0–0.2)

## 2022-09-09 LAB — CMP (CANCER CENTER ONLY)
ALT: 32 U/L (ref 0–44)
AST: 34 U/L (ref 15–41)
Albumin: 4.2 g/dL (ref 3.5–5.0)
Alkaline Phosphatase: 63 U/L (ref 38–126)
Anion gap: 5 (ref 5–15)
BUN: 13 mg/dL (ref 6–20)
CO2: 29 mmol/L (ref 22–32)
Calcium: 9.4 mg/dL (ref 8.9–10.3)
Chloride: 106 mmol/L (ref 98–111)
Creatinine: 1.11 mg/dL (ref 0.61–1.24)
GFR, Estimated: >60 mL/min (ref >60)
Glucose, Bld: 103 mg/dL — ABNORMAL HIGH (ref 70–99)
Potassium: 4.3 mmol/L (ref 3.5–5.1)
Sodium: 140 mmol/L (ref 135–145)
Total Bilirubin: 0.7 mg/dL (ref 0.3–1.2)
Total Protein: 7.2 g/dL (ref 6.5–8.1)

## 2022-09-09 LAB — LACTATE DEHYDROGENASE: LDH: 135 U/L (ref 98–192)

## 2022-09-09 NOTE — Progress Notes (Signed)
[facility] FOLLOW-UP progress notes  Patient Care Team: Patient, No Pcp Per as PCP - General (General Practice)  CHIEF COMPLAINTS/PURPOSE OF VISIT:  History of testicular cancer, multiple new symptoms, for further evaluation  HISTORY OF PRESENTING ILLNESS:  [name] 57 y.o. male was transferred to my care after his prior physician has left.  I reviewed the patient's records extensive and collaborated the history with the patient. Summary of his history is as follows: Oncology History  Testis cancer  09/15/2014 Initial Diagnosis   Testis cancer   09/09/2022 Cancer Staging   Staging form: Testis, AJCC 7th Edition - Clinical stage from 09/09/2022: pT2, N0, M0 - Signed by Artis Delay, MD on 09/09/2022    He received adjuvant treatment with carboplatin He has been following Dr. Clelia Croft on a yearly basis Last imaging study was 2023 Over the course of the last year, he has not been feeling well The patient does not have a primary care doctor and has been to emergency department for evaluation not long ago He has sensation of chest pain and shortness of breath, not always associated with exertion with no other associated symptoms such as diaphoresis or dizziness The patient plays golf at least twice a week, 5 hours on the weekend and 2-1/2 hours on weekday and did not not suffer from any exertional shortness of breath or chest pain He complained of generalized bloating especially after eating He has gained weight gradually over the years which cannot be explained He has not been lifting for over a year because he has significant musculoskeletal discomfort especially on his elbow area He was prescribed meloxicam with improvement of his symptoms He has dry unproductive cough without sinus congestion, fever or sore throat   MEDICAL HISTORY:  Past Medical History:  Diagnosis Date   Headache    tension h/a's   Heart murmur    Sleep apnea    no cpap, unable to tolerate  mask   testicular ca dx'd 07/2014   right   Testicular mass    rt sided   Weakness    arms and legs   Wears glasses    for computer work    SURGICAL HISTORY: Past Surgical History:  Procedure Laterality Date   CERVICAL SPINE SURGERY     w/ plates and screws   ORCHIECTOMY Right 08/01/2014   Procedure: RIGHT RADICAL ORCHIECTOMY WITH PROSTESISPLACEMENT;  Surgeon: Bjorn Pippin, MD;  Location: [facility] [place];  Service: Urology;  Laterality: Right;   SHOULDER ARTHROSCOPY W/ ACROMIAL REPAIR Bilateral     SOCIAL HISTORY: Social History   Socioeconomic History   Marital status: Single    Spouse name: Not on file   Number of children: 2   Years of education: Not on file   Highest education level: Not on file  Occupational History   Occupation: Psychiatric nurse  Tobacco Use   Smoking status: Former   Smokeless tobacco: Never  Building services engineer Use: Never used  Substance and Sexual Activity   Alcohol use: Yes    Alcohol/week: 12.0 standard drinks of alcohol    Types: 12 Cans of beer per week    Comment: a case per week   Drug use: No   Sexual activity: Not on file  Other Topics Concern   Not on file  Social History Narrative   Not on file   Social Determinants of Health   Financial Resource Strain: Not on file  Food Insecurity: Not on  file  Transportation Needs: Not on file  Physical Activity: Not on file  Stress: Not on file  Social Connections: Not on file  Intimate Partner Violence: Not on file    FAMILY HISTORY: Family History  Problem Relation Age of Onset   Heart disease Father    Healthy Mother     ALLERGIES:  is allergic to penicillins.  MEDICATIONS:  Current Outpatient Medications  Medication Sig Dispense Refill   Vilazodone HCl 20 MG TABS Take 20 mg by mouth daily.     ALPRAZolam (XANAX) 1 MG tablet Take 1 mg by mouth 3 (three) times daily as needed for sleep.     finasteride (PROPECIA) 1 MG tablet Take 1 mg by mouth daily.     meloxicam  (MOBIC) 15 MG tablet Take 15 mg by mouth daily.     traZODone (DESYREL) 100 MG tablet Take 100 mg by mouth at bedtime.     No current facility-administered medications for this visit.    REVIEW OF SYSTEMS:   Constitutional: Denies fevers, chills or abnormal night sweats Eyes: Denies blurriness of vision, double vision or watery eyes Gastrointestinal:  Denies nausea, heartburn or change in bowel habits Skin: Denies abnormal skin rashes Lymphatics: Denies new lymphadenopathy or easy bruising Neurological:Denies numbness, tingling or new weaknesses Behavioral/Psych: Mood is stable, no new changes  All other systems were reviewed with the patient and are negative.  PHYSICAL EXAMINATION: ECOG PERFORMANCE STATUS: 1 - Symptomatic but completely ambulatory  Vitals:   09/09/22 0845  BP: (!) 115/56  Pulse: (!) 56  Resp: 18  Temp: 98 F (36.7 C)  SpO2: 98%   Filed Weights   09/09/22 0845  Weight: 163 lb (73.9 kg)    GENERAL:alert, no distress and comfortable SKIN: skin color, texture, turgor are normal, no rashes or significant lesions EYES: normal, conjunctiva are pink and non-injected, sclera clear OROPHARYNX:no exudate, normal lips, buccal mucosa, and tongue  NECK: supple, thyroid normal size, non-tender, without nodularity LYMPH:  no palpable lymphadenopathy in the cervical, axillary or inguinal LUNGS: clear to auscultation and percussion with normal breathing effort HEART: regular rate & rhythm and no murmurs without lower extremity edema ABDOMEN:abdomen soft, mild discomfort on palpation in the periumbilical region without rebound or guarding Musculoskeletal:no cyanosis of digits and no clubbing  PSYCH: alert & oriented x 3 with fluent speech NEURO: no focal motor/sensory deficits  LABORATORY DATA:  I have reviewed the data as listed Lab Results  Component Value Date   WBC 7.8 09/09/2022   HGB 15.9 09/09/2022   HCT 46.8 09/09/2022   MCV 89.3 09/09/2022   PLT 142 (L)  09/09/2022   Recent Labs    05/25/22 1026 09/09/22 0829  NA 141 140  K 3.6 4.3  CL 103 106  CO2 27 29  GLUCOSE 196* 103*  BUN 13 13  CREATININE 1.10 1.11  CALCIUM 9.5 9.4  GFRNONAA >60 >60  PROT  --  7.2  ALBUMIN  --  4.2  AST  --  34  ALT  --  32  ALKPHOS  --  63  BILITOT  --  0.7    RADIOGRAPHIC STUDIES: I reviewed his prior CT imaging from 2023 I have personally reviewed the radiological images as listed and agreed with the findings in the report.  ASSESSMENT & PLAN:  Testis cancer The patient has completed treatment many years ago However, I am concerned about constellation of various different symptoms that cannot be explained I recommend CT imaging of the  chest, abdomen and pelvis to rule out recurrent cancer and he is in agreement  Thrombocytopenia This is chronic in nature, could be related to his alcohol intake and fatty liver disease Observe closely He is not symptomatic  Dyspnea He has profound shortness of breath that cannot be explained by physical examination I will order imaging study for evaluation  Cough His examination is benign but the patient was coughing nonstop in the office I will order imaging study for evaluation  Bowel habit changes He has sensation of bloating He has intermittent abdominal discomfort His last colonoscopy was many years ago His last CT imaging from 2023 showed evidence of constipation As above, I will order imaging study I recommend the patient to reach out to his gastroenterologist for further evaluation  Bloating His examination does not show any evidence of distention I recommend imaging study as above  Orders Placed This Encounter  Procedures   CT CHEST ABDOMEN PELVIS W CONTRAST    Standing Status:   Future    Standing Expiration Date:   09/09/2023    Order Specific Question:   Preferred imaging location?    Answer:   [facility]    Order Specific Question:   Radiology Contrast Protocol - do NOT  remove file path    Answer:   [email or web address]    All questions were answered. The patient knows to call the clinic with any problems, questions or concerns. The total time spent in the appointment was 40 minutes encounter with patients including review of chart and various tests results, discussions about plan of care and coordination of care plan   Artis Delay, MD 09/09/2022 11:32 AM

## 2022-09-09 NOTE — Assessment & Plan Note (Signed)
His examination is benign but the patient was coughing nonstop in the office I will order imaging study for evaluation

## 2022-09-09 NOTE — Assessment & Plan Note (Signed)
He has profound shortness of breath that cannot be explained by physical examination I will order imaging study for evaluation

## 2022-09-09 NOTE — Assessment & Plan Note (Signed)
His examination does not show any evidence of distention I recommend imaging study as above

## 2022-09-09 NOTE — Assessment & Plan Note (Signed)
He has sensation of bloating He has intermittent abdominal discomfort His last colonoscopy was many years ago His last CT imaging from 2023 showed evidence of constipation As above, I will order imaging study I recommend the patient to reach out to his gastroenterologist for further evaluation

## 2022-09-09 NOTE — Assessment & Plan Note (Signed)
This is chronic in nature, could be related to his alcohol intake and fatty liver disease Observe closely He is not symptomatic

## 2022-09-09 NOTE — Assessment & Plan Note (Signed)
The patient has completed treatment many years ago However, I am concerned about constellation of various different symptoms that cannot be explained I recommend CT imaging of the chest, abdomen and pelvis to rule out recurrent cancer and he is in agreement

## 2022-09-10 ENCOUNTER — Ambulatory Visit: Payer: BC Managed Care – PPO | Admitting: Oncology

## 2022-09-10 ENCOUNTER — Other Ambulatory Visit: Payer: BC Managed Care – PPO

## 2022-09-11 LAB — BETA HCG QUANT (REF LAB): hCG Quant: <1 m[IU]/mL (ref 0–3)

## 2022-09-11 LAB — AFP TUMOR MARKER: AFP, Serum, Tumor Marker: 3.1 ng/mL (ref 0.0–8.4)

## 2022-09-13 ENCOUNTER — Ambulatory Visit (HOSPITAL_BASED_OUTPATIENT_CLINIC_OR_DEPARTMENT_OTHER)
Admission: RE | Admit: 2022-09-13 | Discharge: 2022-09-13 | Disposition: A | Discharge status: Home / Self Care | Payer: BC Managed Care – PPO | Source: Ambulatory Visit | Attending: Hematology and Oncology | Admitting: Hematology and Oncology

## 2022-09-13 ENCOUNTER — Telehealth: Payer: Self-pay | Admitting: Hematology and Oncology

## 2022-09-13 DIAGNOSIS — R194 Change in bowel habit: Secondary | ICD-10-CM

## 2022-09-13 DIAGNOSIS — R140 Abdominal distension (gaseous): Secondary | ICD-10-CM | POA: Diagnosis present

## 2022-09-13 DIAGNOSIS — C6292 Malignant neoplasm of left testis, unspecified whether descended or undescended: Secondary | ICD-10-CM

## 2022-09-13 MED ORDER — METRONIDAZOLE 500 MG PO TABS: 500.0000 mg | ORAL_TABLET | Freq: Three times a day (TID) | ORAL | 0 refills | Status: DC

## 2022-09-13 MED ORDER — IOHEXOL 300 MG/ML  SOLN
100.0000 mL | Freq: Once | INTRAMUSCULAR | Status: AC | PRN
  Administered 2022-09-13: 80 mL via INTRAVENOUS

## 2022-09-13 MED ORDER — CIPROFLOXACIN HCL 500 MG PO TABS: 500.0000 mg | ORAL_TABLET | Freq: Two times a day (BID) | ORAL | 0 refills | Status: DC

## 2022-09-13 NOTE — Telephone Encounter (Signed)
I got a call from radiology about acute uncomplicated diverticulitis. Patient says he has been having abdominal pain for a long time, no new pain, new onset diarrhea for the past 3/4 days. No fevers or chills. He has been having bloating and abdominal discomfort for a while he says, he denies any change in these symptoms. He is agreeable to trying abx. This has been dispensed to pharmacy of his choice. He was clearly instructed to call us back with worsening of symptoms or if he is unable to tolerate abx. He has a FU with Dr Bertis Ruddy on Friday, encouraged him to keep this appt. This note will be routed to Dr Bertis Ruddy.  Nikita Humble

## 2022-09-15 ENCOUNTER — Telehealth: Payer: Self-pay

## 2022-09-15 NOTE — Telephone Encounter (Signed)
Called to follow up with him and see how he is feeling today. Dr. Al Pimple sent antibiotics in over the weekend due to CT results. He is complaining of some diarrhea but had some prior to CT. He is complaining of abdominal pain/ some bloating.  He is aware of appt on 4/26 and will call the office back for concerns.

## 2022-09-19 ENCOUNTER — Encounter: Payer: Self-pay | Admitting: Hematology and Oncology

## 2022-09-19 ENCOUNTER — Inpatient Hospital Stay (HOSPITAL_BASED_OUTPATIENT_CLINIC_OR_DEPARTMENT_OTHER): Payer: BC Managed Care – PPO | Admitting: Hematology and Oncology

## 2022-09-19 VITALS — BP 130/75 | HR 59 | Temp 97.7°F | Resp 18 | Ht 67.0 in | Wt 167.8 lb

## 2022-09-19 DIAGNOSIS — C6292 Malignant neoplasm of left testis, unspecified whether descended or undescended: Secondary | ICD-10-CM

## 2022-09-19 DIAGNOSIS — R059 Cough, unspecified: Secondary | ICD-10-CM

## 2022-09-19 DIAGNOSIS — R194 Change in bowel habit: Secondary | ICD-10-CM

## 2022-09-19 MED ORDER — GUAIFENESIN-DM 100-10 MG/5ML PO SYRP: 5.0000 mL | ORAL_SOLUTION | ORAL | 0 refills | Status: DC | PRN

## 2022-09-19 NOTE — Progress Notes (Signed)
[facility] OFFICE PROGRESS NOTE  Patient Care Team: Patient, No Pcp Per as PCP - General (General Practice)  ASSESSMENT & PLAN:  Testis cancer I have reviewed imaging studies with the patient There is no signs of cancer recurrence He does not need long-term follow-up  Cough The cause of his cough is unknown I recommend pulmonary consult Per patient request, I have sent prescription of cough suppressant for him.  I warned the patient it may may not help  Bowel habit changes He continues to have bowel habits changes and bloating.  He will complete a course of antibiotics soon.  I would recommend the patient to reach out to his gastroenterologist for further management  No orders of the defined types were placed in this encounter.   All questions were answered. The patient knows to call the clinic with any problems, questions or concerns. The total time spent in the appointment was 20 minutes encounter with patients including review of chart and various tests results, discussions about plan of care and coordination of care plan   Artis Delay, MD 09/19/2022 1:59 PM  INTERVAL HISTORY: Please see below for problem oriented charting. he returns for review test results He was prescribed antibiotics due to recent findings suggestive of acute diverticulitis He had some nausea and unpleasant metallic taste from the antibiotics He still have persistent cough We spent majority of our time reviewing imaging studies  REVIEW OF SYSTEMS:   Constitutional: Denies fevers, chills or abnormal weight loss Eyes: Denies blurriness of vision Ears, nose, mouth, throat, and face: Denies mucositis or sore throat Cardiovascular: Denies palpitation, chest discomfort or lower extremity swelling Gastrointestinal:  Denies nausea, heartburn or change in bowel habits Skin: Denies abnormal skin rashes Lymphatics: Denies new lymphadenopathy or easy bruising Neurological:Denies numbness, tingling  or new weaknesses Behavioral/Psych: Mood is stable, no new changes  All other systems were reviewed with the patient and are negative.  I have reviewed the past medical history, past surgical history, social history and family history with the patient and they are unchanged from previous note.  ALLERGIES:  is allergic to penicillins.  MEDICATIONS:  Current Outpatient Medications  Medication Sig Dispense Refill   guaiFENesin-dextromethorphan (ROBITUSSIN DM) 100-10 MG/5ML syrup Take 5 mLs by mouth every 4 (four) hours as needed for cough. 473 mL 0   ALPRAZolam (XANAX) 1 MG tablet Take 1 mg by mouth 3 (three) times daily as needed for sleep.     ciprofloxacin (CIPRO) 500 MG tablet Take 1 tablet (500 mg total) by mouth 2 (two) times daily. 14 tablet 0   finasteride (PROPECIA) 1 MG tablet Take 1 mg by mouth daily.     meloxicam (MOBIC) 15 MG tablet Take 15 mg by mouth daily.     metroNIDAZOLE (FLAGYL) 500 MG tablet Take 1 tablet (500 mg total) by mouth 3 (three) times daily. 21 tablet 0   traZODone (DESYREL) 100 MG tablet Take 100 mg by mouth at bedtime.     Vilazodone HCl 20 MG TABS Take 20 mg by mouth daily.     No current facility-administered medications for this visit.    SUMMARY OF ONCOLOGIC HISTORY: Oncology History  Testis cancer (HCC)  09/15/2014 Initial Diagnosis   Testis cancer   09/09/2022 Cancer Staging   Staging form: Testis, AJCC 7th Edition - Clinical stage from 09/09/2022: pT2, N0, M0 - Signed by Artis Delay, MD on 09/09/2022   09/12/2022 Imaging   CT CHEST ABDOMEN PELVIS W CONTRAST  Result Date:  09/13/2022 CLINICAL DATA:  History of testicular cancer. Follow-up. * Tracking Code: BO * EXAM: CT CHEST, ABDOMEN, AND PELVIS WITH CONTRAST TECHNIQUE: Multidetector CT imaging of the chest, abdomen and pelvis was performed following the standard protocol during bolus administration of intravenous contrast. RADIATION DOSE REDUCTION: This exam was performed according to the  departmental dose-optimization program which includes automated exposure control, adjustment of the mA and/or kV according to patient size and/or use of iterative reconstruction technique. CONTRAST:  80mL OMNIPAQUE IOHEXOL 300 MG/ML  SOLN COMPARISON:  Multiple priors including CT August 02, 2021 and March 27, 2020. FINDINGS: CT CHEST FINDINGS Cardiovascular: Aortic atherosclerosis. Normal caliber thoracic aorta. No central pulmonary embolus on this nondedicated study. Normal size heart. No significant pericardial effusion/thickening. Mediastinum/Nodes: No pathologically enlarged mediastinal, hilar or axillary lymph nodes. No suspicious thyroid nodule. The esophagus is grossly unremarkable. Lungs/Pleura: No suspicious pulmonary nodules or masses. No pleural effusion. No pneumothorax. Musculoskeletal: No aggressive lytic or blastic lesion of bone. Anterior cervical fusion hardware. CT ABDOMEN PELVIS FINDINGS Hepatobiliary: Diffuse hepatic steatosis. Stable hepatic hemangiomata measuring up to 3.4 cm in the central right lobe of the liver on image 52/4. Gallbladder is unremarkable. No biliary ductal dilation. Pancreas: No pancreatic ductal dilation or evidence of acute inflammation. Periampullary duodenal diverticulum. Spleen: No splenomegaly. Adrenals/Urinary Tract: Bilateral adrenal glands appear normal. No hydronephrosis. Kidneys demonstrate symmetric enhancement and excretion of contrast material. Urinary bladder is unremarkable for degree of distension. Stomach/Bowel: Radiopaque enteric contrast material traverses the rectum. Stomach is unremarkable for degree of distension. No pathologic dilation of small or large bowel. Colonic diverticulosis with focal short segment wall thickening of the sigmoid colon and fluid/stranding in the sigmoid mesentery. Vascular/Lymphatic: Aortic atherosclerosis. Smooth IVC contours. No pathologically enlarged abdominal or pelvic lymph nodes. Reproductive: Testicular prosthesis  noted. Other: No pneumoperitoneum. Musculoskeletal: No aggressive lytic or blastic lesion of bone. Mixed lesion in the left femoral head is stable dating back to at least December 28, 2018 compatible with a benign finding. Multilevel degenerative changes spine. IMPRESSION: 1. Findings suggestive of acute uncomplicated sigmoid diverticulitis. 2. No evidence of metastatic disease within the chest, abdomen or pelvis. 3. Diffuse hepatic steatosis. 4.  Aortic Atherosclerosis (ICD10-I70.0). These results will be called to the ordering clinician or representative by the Radiologist Assistant, and communication documented in the PACS or [employer]. Electronically Signed   By: Maudry Mayhew M.D.   On: 09/13/2022 14:44        PHYSICAL EXAMINATION: ECOG PERFORMANCE STATUS: 1 - Symptomatic but completely ambulatory  Vitals:   09/19/22 1222  BP: 130/75  Pulse: (!) 59  Resp: 18  Temp: 97.7 F (36.5 C)  SpO2: 100%   Filed Weights   09/19/22 1222  Weight: 167 lb 12.8 oz (76.1 kg)    GENERAL:alert, no distress and comfortable NEURO: alert & oriented x 3 with fluent speech, no focal motor/sensory deficits  LABORATORY DATA:  I have reviewed the data as listed    Component Value Date/Time   NA 140 09/09/2022 0829   NA 139 12/19/2016 0814   K 4.3 09/09/2022 0829   K 4.3 12/19/2016 0814   CL 106 09/09/2022 0829   CO2 29 09/09/2022 0829   CO2 26 12/19/2016 0814   GLUCOSE 103 (H) 09/09/2022 0829   GLUCOSE 104 12/19/2016 0814   BUN 13 09/09/2022 0829   BUN 10.5 12/19/2016 0814   CREATININE 1.11 09/09/2022 0829   CREATININE 1.1 12/19/2016 0814   CALCIUM 9.4 09/09/2022 0829   CALCIUM 9.6 12/19/2016  0814   PROT 7.2 09/09/2022 0829   PROT 7.0 12/19/2016 0814   ALBUMIN 4.2 09/09/2022 0829   ALBUMIN 4.0 12/19/2016 0814   AST 34 09/09/2022 0829   AST 22 12/19/2016 0814   ALT 32 09/09/2022 0829   ALT 26 12/19/2016 0814   ALKPHOS 63 09/09/2022 0829   ALKPHOS 71 12/19/2016 0814   BILITOT 0.7  09/09/2022 0829   BILITOT 0.73 12/19/2016 0814   GFRNONAA >60 09/09/2022 0829   GFRAA >60 01/03/2020 0952    No results found for: "SPEP", "UPEP"  Lab Results  Component Value Date   WBC 7.8 09/09/2022   NEUTROABS 4.7 09/09/2022   HGB 15.9 09/09/2022   HCT 46.8 09/09/2022   MCV 89.3 09/09/2022   PLT 142 (L) 09/09/2022      Chemistry      Component Value Date/Time   NA 140 09/09/2022 0829   NA 139 12/19/2016 0814   K 4.3 09/09/2022 0829   K 4.3 12/19/2016 0814   CL 106 09/09/2022 0829   CO2 29 09/09/2022 0829   CO2 26 12/19/2016 0814   BUN 13 09/09/2022 0829   BUN 10.5 12/19/2016 0814   CREATININE 1.11 09/09/2022 0829   CREATININE 1.1 12/19/2016 0814      Component Value Date/Time   CALCIUM 9.4 09/09/2022 0829   CALCIUM 9.6 12/19/2016 0814   ALKPHOS 63 09/09/2022 0829   ALKPHOS 71 12/19/2016 0814   AST 34 09/09/2022 0829   AST 22 12/19/2016 0814   ALT 32 09/09/2022 0829   ALT 26 12/19/2016 0814   BILITOT 0.7 09/09/2022 0829   BILITOT 0.73 12/19/2016 0814       RADIOGRAPHIC STUDIES: I have reviewed imaging study with the patient I have personally reviewed the radiological images as listed and agreed with the findings in the report. CT CHEST ABDOMEN PELVIS W CONTRAST  Result Date: 09/13/2022 CLINICAL DATA:  History of testicular cancer. Follow-up. * Tracking Code: BO * EXAM: CT CHEST, ABDOMEN, AND PELVIS WITH CONTRAST TECHNIQUE: Multidetector CT imaging of the chest, abdomen and pelvis was performed following the standard protocol during bolus administration of intravenous contrast. RADIATION DOSE REDUCTION: This exam was performed according to the departmental dose-optimization program which includes automated exposure control, adjustment of the mA and/or kV according to patient size and/or use of iterative reconstruction technique. CONTRAST:  80mL OMNIPAQUE IOHEXOL 300 MG/ML  SOLN COMPARISON:  Multiple priors including CT August 02, 2021 and March 27, 2020.  FINDINGS: CT CHEST FINDINGS Cardiovascular: Aortic atherosclerosis. Normal caliber thoracic aorta. No central pulmonary embolus on this nondedicated study. Normal size heart. No significant pericardial effusion/thickening. Mediastinum/Nodes: No pathologically enlarged mediastinal, hilar or axillary lymph nodes. No suspicious thyroid nodule. The esophagus is grossly unremarkable. Lungs/Pleura: No suspicious pulmonary nodules or masses. No pleural effusion. No pneumothorax. Musculoskeletal: No aggressive lytic or blastic lesion of bone. Anterior cervical fusion hardware. CT ABDOMEN PELVIS FINDINGS Hepatobiliary: Diffuse hepatic steatosis. Stable hepatic hemangiomata measuring up to 3.4 cm in the central right lobe of the liver on image 52/4. Gallbladder is unremarkable. No biliary ductal dilation. Pancreas: No pancreatic ductal dilation or evidence of acute inflammation. Periampullary duodenal diverticulum. Spleen: No splenomegaly. Adrenals/Urinary Tract: Bilateral adrenal glands appear normal. No hydronephrosis. Kidneys demonstrate symmetric enhancement and excretion of contrast material. Urinary bladder is unremarkable for degree of distension. Stomach/Bowel: Radiopaque enteric contrast material traverses the rectum. Stomach is unremarkable for degree of distension. No pathologic dilation of small or large bowel. Colonic diverticulosis with focal short segment wall  thickening of the sigmoid colon and fluid/stranding in the sigmoid mesentery. Vascular/Lymphatic: Aortic atherosclerosis. Smooth IVC contours. No pathologically enlarged abdominal or pelvic lymph nodes. Reproductive: Testicular prosthesis noted. Other: No pneumoperitoneum. Musculoskeletal: No aggressive lytic or blastic lesion of bone. Mixed lesion in the left femoral head is stable dating back to at least December 28, 2018 compatible with a benign finding. Multilevel degenerative changes spine. IMPRESSION: 1. Findings suggestive of acute uncomplicated  sigmoid diverticulitis. 2. No evidence of metastatic disease within the chest, abdomen or pelvis. 3. Diffuse hepatic steatosis. 4.  Aortic Atherosclerosis (ICD10-I70.0). These results will be called to the ordering clinician or representative by the Radiologist Assistant, and communication documented in the PACS or [employer]. Electronically Signed   By: Maudry Mayhew M.D.   On: 09/13/2022 14:44

## 2022-09-19 NOTE — Assessment & Plan Note (Signed)
I have reviewed imaging studies with the patient There is no signs of cancer recurrence He does not need long-term follow-up

## 2022-09-19 NOTE — Assessment & Plan Note (Signed)
The cause of his cough is unknown I recommend pulmonary consult Per patient request, I have sent prescription of cough suppressant for him.  I warned the patient it may may not help

## 2022-09-19 NOTE — Assessment & Plan Note (Signed)
He continues to have bowel habits changes and bloating.  He will complete a course of antibiotics soon.  I would recommend the patient to reach out to his gastroenterologist for further management

## 2023-02-11 ENCOUNTER — Ambulatory Visit
Admission: EM | Admit: 2023-02-11 | Discharge: 2023-02-11 | Disposition: A | Discharge status: Home / Self Care | Payer: BC Managed Care – PPO

## 2023-02-11 ENCOUNTER — Other Ambulatory Visit: Payer: Self-pay

## 2023-02-11 DIAGNOSIS — H6123 Impacted cerumen, bilateral: Secondary | ICD-10-CM | POA: Diagnosis not present

## 2023-02-11 DIAGNOSIS — H60392 Other infective otitis externa, left ear: Secondary | ICD-10-CM | POA: Diagnosis not present

## 2023-02-11 MED ORDER — OFLOXACIN 0.3 % OT SOLN: 10.0000 [drp] | Freq: Every day | OTIC | 0 refills | Status: DC

## 2023-02-11 NOTE — ED Triage Notes (Addendum)
Left ear pressure and unable to hear out of it since tuesday

## 2023-02-11 NOTE — ED Provider Notes (Signed)
[name]    CSN: [id number] Arrival date & time: 02/11/23  0630      History   Chief Complaint Chief Complaint  Patient presents with   Ear Fullness    HPI [name] is a 57 y.o. male.   [name] is a 57 y.o. male presenting for chief complaint of ear fullness and decreased hearing from the left ear that started 2 days ago on Monday, September 16th 2024.  No recent trauma or injuries to the ears.  Denies tinnitus, dizziness, headache, fever/chills, viral URI symptoms, and recent drainage from the ears.  He attempted to clean out the ears with peroxide last night unsuccessfully.    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   Testicular mass    rt sided   Weakness    arms and legs   Wears glasses    for computer work    Patient Active Problem List   Diagnosis Date Noted   Dyspnea 09/09/2022   Thrombocytopenia (HCC) 09/09/2022   Cough 09/09/2022   Bowel habit changes 01/31/2015   Bloating 01/31/2015   Testis cancer (HCC) 09/15/2014    Past Surgical History:  Procedure Laterality Date   CERVICAL SPINE SURGERY     w/ plates and screws   ORCHIECTOMY Right 08/01/2014   Procedure: RIGHT RADICAL ORCHIECTOMY WITH PROSTESISPLACEMENT;  Surgeon: Bjorn Pippin, MD;  Location: [facility];  Service: Urology;  Laterality: Right;   SHOULDER ARTHROSCOPY W/ ACROMIAL REPAIR Bilateral        Home Medications    Prior to Admission medications   Medication Sig Start Date End Date Taking? Authorizing Provider  Dextromethorphan-Bupropion (AUVELITY PO) Take 45 mg by mouth.   Yes [provider]  ofloxacin (FLOXIN) 0.3 % OTIC solution Place 10 drops into the left ear daily. 02/11/23  Yes Honesty Menta, Donavan Burnet, FNP  ALPRAZolam Prudy Feeler) 1 MG tablet Take 1 mg by mouth 3 (three) times daily as  needed for sleep.    [provider]  ciprofloxacin (CIPRO) 500 MG tablet Take 1 tablet (500 mg total) by mouth 2 (two) times daily. 09/13/22   Rachel Moulds, MD  finasteride (PROPECIA) 1 MG tablet Take 1 mg by mouth daily.    [provider]  guaiFENesin-dextromethorphan (ROBITUSSIN DM) 100-10 MG/5ML syrup Take 5 mLs by mouth every 4 (four) hours as needed for cough. 09/19/22   Artis Delay, MD  meloxicam (MOBIC) 15 MG tablet Take 15 mg by mouth daily.    [provider]  metroNIDAZOLE (FLAGYL) 500 MG tablet Take 1 tablet (500 mg total) by mouth 3 (three) times daily. 09/13/22   Rachel Moulds, MD  traZODone (DESYREL) 100 MG tablet Take 100 mg by mouth at bedtime.    [provider]  Vilazodone HCl 20 MG TABS Take 20 mg by mouth daily.    [provider]    Family History Family History  Problem Relation Age of Onset   Heart disease Father    Healthy Mother     Social History Social History   Tobacco Use   Smoking status: Former   Smokeless tobacco: Never  Vaping Use   Vaping status: Never Used  Substance Use Topics   Alcohol use: Yes  Alcohol/week: 12.0 standard drinks of alcohol    Types: 12 Cans of beer per week    Comment: a case per week   Drug use: No     Allergies   Penicillins   Review of Systems Review of Systems Per HPI  Physical Exam Triage Vital Signs ED Triage Vitals [02/11/23 0953]  Encounter Vitals Group     BP 127/77     Systolic BP Percentile      Diastolic BP Percentile      Pulse Rate 60     Resp 16     Temp 97.7 F (36.5 C)     Temp Source Oral     SpO2 99 %     Weight      Height      Head Circumference      Peak Flow      Pain Score 3     Pain Loc      Pain Education      Exclude from Growth Chart    No data found.  Updated Vital Signs BP 127/77 (BP Location: Left Arm)   Pulse 60   Temp 97.7 F (36.5 C) (Oral)   Resp 16   SpO2 99%   Visual Acuity Right Eye Distance:   Left  Eye Distance:   Bilateral Distance:    Right Eye Near:   Left Eye Near:    Bilateral Near:     Physical Exam Vitals and nursing note reviewed.  Constitutional:      Appearance: He is not ill-appearing or toxic-appearing.  HENT:     Head: Normocephalic and atraumatic.     Right Ear: Hearing, tympanic membrane, ear canal and external ear normal. There is impacted cerumen.     Left Ear: Hearing, tympanic membrane, ear canal and external ear normal. There is impacted cerumen.     Nose: Nose normal.     Mouth/Throat:     Lips: Pink.  Eyes:     General: Lids are normal. Vision grossly intact. Gaze aligned appropriately.     Extraocular Movements: Extraocular movements intact.     Conjunctiva/sclera: Conjunctivae normal.  Pulmonary:     Effort: Pulmonary effort is normal.  Musculoskeletal:     Cervical back: Neck supple.  Skin:    General: Skin is warm and dry.     Capillary Refill: Capillary refill takes less than 2 seconds.     Findings: No rash.  Neurological:     General: No focal deficit present.     Mental Status: He is alert and oriented to person, place, and time. Mental status is at baseline.     Cranial Nerves: No dysarthria or facial asymmetry.  Psychiatric:        Mood and Affect: Mood normal.        Speech: Speech normal.        Behavior: Behavior normal.        Thought Content: Thought content normal.        Judgment: Judgment normal.      UC Treatments / Results  Labs (all labs ordered are listed, but only abnormal results are displayed) Labs Reviewed - No data to display  EKG   Radiology No results found.  Procedures Procedures (including critical care time)  Medications Ordered in UC Medications - No data to display  Initial Impression / Assessment and Plan / UC Course  I have reviewed the triage vital signs and the nursing notes.  Pertinent labs & imaging  results that were available during my care of the patient were reviewed by me and  considered in my medical decision making (see chart for details).   1. Bilateral impacted cerumen, other infective acute otitis externa of left ear Bilateral ears cleaned with ear lavage to remove ear wax impactions bilaterally by nursing staff. Reassessment shows normal right ear canal and normal tympanic membrane. Left tympanic membrane intact, reassessment shows small amount of purulent drainage and erythema to the left ear cana indicating likely otitis externa from moisture buildup behind ear wax. Ofloxacin ear drops once daily for 7 days as prescribed.  Patient may use debrox ear drops at home as needed for wax removal and has been advised to avoid using Q-tips.   Counseled patient on potential for adverse effects with medications prescribed/recommended today, strict ER and return-to-clinic precautions discussed, patient verbalized understanding.    Patient left clinic prior to receiving AVS. Final Clinical Impressions(s) / UC Diagnoses   Final diagnoses:  Bilateral impacted cerumen  Other infective acute otitis externa of left ear   Discharge Instructions   None    ED Prescriptions     Medication Sig Dispense Auth. Provider   ofloxacin (FLOXIN) 0.3 % OTIC solution Place 10 drops into the left ear daily. 5 mL Carlisle Beers, FNP      PDMP not reviewed this encounter.   Carlisle Beers, [place] 02/11/23 1018

## 2023-06-08 ENCOUNTER — Emergency Department (HOSPITAL_BASED_OUTPATIENT_CLINIC_OR_DEPARTMENT_OTHER)
Admission: EM | Admit: 2023-06-08 | Discharge: 2023-06-08 | Disposition: A | Discharge status: Home / Self Care | Payer: BC Managed Care – PPO | Attending: Emergency Medicine | Admitting: Emergency Medicine

## 2023-06-08 ENCOUNTER — Emergency Department (HOSPITAL_BASED_OUTPATIENT_CLINIC_OR_DEPARTMENT_OTHER): Payer: BC Managed Care – PPO | Admitting: Radiology

## 2023-06-08 ENCOUNTER — Other Ambulatory Visit: Payer: Self-pay

## 2023-06-08 ENCOUNTER — Emergency Department (HOSPITAL_BASED_OUTPATIENT_CLINIC_OR_DEPARTMENT_OTHER): Payer: BC Managed Care – PPO

## 2023-06-08 ENCOUNTER — Encounter (HOSPITAL_BASED_OUTPATIENT_CLINIC_OR_DEPARTMENT_OTHER): Payer: Self-pay

## 2023-06-08 DIAGNOSIS — M25561 Pain in right knee: Secondary | ICD-10-CM | POA: Diagnosis present

## 2023-06-08 DIAGNOSIS — M25562 Pain in left knee: Secondary | ICD-10-CM | POA: Diagnosis not present

## 2023-06-08 DIAGNOSIS — R1032 Left lower quadrant pain: Secondary | ICD-10-CM | POA: Insufficient documentation

## 2023-06-08 DIAGNOSIS — M25551 Pain in right hip: Secondary | ICD-10-CM

## 2023-06-08 DIAGNOSIS — Z8547 Personal history of malignant neoplasm of testis: Secondary | ICD-10-CM | POA: Insufficient documentation

## 2023-06-08 DIAGNOSIS — R1031 Right lower quadrant pain: Secondary | ICD-10-CM | POA: Insufficient documentation

## 2023-06-08 LAB — CBC
HCT: 44.1 % (ref 39.0–52.0)
Hemoglobin: 15.2 g/dL (ref 13.0–17.0)
MCH: 30.2 pg (ref 26.0–34.0)
MCHC: 34.5 g/dL (ref 30.0–36.0)
MCV: 87.7 fL (ref 80.0–100.0)
Platelets: 123 10*3/uL — ABNORMAL LOW (ref 150–400)
RBC: 5.03 MIL/uL (ref 4.22–5.81)
RDW: 12.9 % (ref 11.5–15.5)
WBC: 6.1 10*3/uL (ref 4.0–10.5)
nRBC: 0 % (ref 0.0–0.2)

## 2023-06-08 LAB — COMPREHENSIVE METABOLIC PANEL
ALT: 37 U/L (ref 0–44)
AST: 34 U/L (ref 15–41)
Albumin: 4.1 g/dL (ref 3.5–5.0)
Alkaline Phosphatase: 64 U/L (ref 38–126)
Anion gap: 7 (ref 5–15)
BUN: 14 mg/dL (ref 6–20)
CO2: 30 mmol/L (ref 22–32)
Calcium: 9.1 mg/dL (ref 8.9–10.3)
Chloride: 101 mmol/L (ref 98–111)
Creatinine, Ser: 1.25 mg/dL — ABNORMAL HIGH (ref 0.61–1.24)
GFR, Estimated: >60 mL/min (ref >60)
Glucose, Bld: 180 mg/dL — ABNORMAL HIGH (ref 70–99)
Potassium: 3.9 mmol/L (ref 3.5–5.1)
Sodium: 138 mmol/L (ref 135–145)
Total Bilirubin: 0.5 mg/dL (ref 0.0–1.2)
Total Protein: 6.8 g/dL (ref 6.5–8.1)

## 2023-06-08 LAB — URINALYSIS, ROUTINE W REFLEX MICROSCOPIC
Bacteria, UA: NONE SEEN
Bilirubin Urine: NEGATIVE
Glucose, UA: NEGATIVE mg/dL
Hgb urine dipstick: NEGATIVE
Ketones, ur: NEGATIVE mg/dL
Leukocytes,Ua: NEGATIVE
Nitrite: NEGATIVE
Protein, ur: 30 mg/dL — AB
Specific Gravity, Urine: >1.046 — ABNORMAL HIGH (ref 1.005–1.030)
pH: 6.5 (ref 5.0–8.0)

## 2023-06-08 MED ORDER — MELOXICAM 15 MG PO TABS: 15.0000 mg | ORAL_TABLET | Freq: Every day | ORAL | 0 refills | Status: AC | PRN

## 2023-06-08 MED ORDER — IOHEXOL 300 MG/ML  SOLN
100.0000 mL | Freq: Once | INTRAMUSCULAR | Status: AC | PRN
  Administered 2023-06-08: 85 mL via INTRAVENOUS

## 2023-06-08 MED ORDER — NAPROXEN 250 MG PO TABS
500.0000 mg | ORAL_TABLET | Freq: Once | ORAL | Status: AC
  Administered 2023-06-08: 500 mg via ORAL
  Filled 2023-06-08: qty 2

## 2023-06-08 NOTE — ED Provider Notes (Signed)
  EMERGENCY DEPARTMENT AT [facility] Provider Note   CSN: [id number] Arrival date & time: 06/08/23  1154     History  Chief Complaint  Patient presents with   Back Pain   Groin Pain    [name] is a 58 y.o. male with a history of testicular cancer and thrombocytopenia presents to the ED today for hip pain.  Patient reports a 58-month history of pain at his groin, inner thighs, bilateral hips, and across his low back.  No known trauma or injury prior to onset of symptoms. Pain is sharp and stabbing and worse with movement.  He is tried Meloxicam  without relief as well as Ibuprofen.  He denies any fevers, nausea, vomiting, diarrhea, or dysuria.  Patient is concerned that he might have cancer metastasis.  He denies any testicular pain or pain to the penis.  No additional complaints or concerns at this time.    Home Medications Prior to Admission medications   Medication Sig Start Date End Date Taking? Authorizing Provider  meloxicam  (MOBIC ) 15 MG tablet Take 1 tablet (15 mg total) by mouth daily as needed for pain. 06/08/23 07/08/23 Yes Waddell Sluder, PA-C  ALPRAZolam (XANAX) 1 MG tablet Take 1 mg by mouth 3 (three) times daily as needed for sleep.    [provider]  ciprofloxacin  (CIPRO ) 500 MG tablet Take 1 tablet (500 mg total) by mouth 2 (two) times daily. 09/13/22   Iruku, Praveena, MD  Dextromethorphan-Bupropion (AUVELITY PO) Take 45 mg by mouth.    [provider]  finasteride (PROPECIA) 1 MG tablet Take 1 mg by mouth daily.    [provider]  guaiFENesin -dextromethorphan (ROBITUSSIN DM) 100-10 MG/5ML syrup Take 5 mLs by mouth every 4 (four) hours as needed for cough. 09/19/22   Lonn Hicks, MD  metroNIDAZOLE  (FLAGYL ) 500 MG tablet Take 1 tablet (500 mg total) by mouth 3 (three) times daily. 09/13/22   Iruku, Praveena, MD  ofloxacin  (FLOXIN ) 0.3 % OTIC solution Place 10 drops into the left ear daily. 02/11/23   Enedelia Dorna HERO, FNP   traZODone (DESYREL) 100 MG tablet Take 100 mg by mouth at bedtime.    [provider]  Vilazodone HCl 20 MG TABS Take 20 mg by mouth daily.    [provider]      Allergies    Penicillins    Review of Systems   Review of Systems  Musculoskeletal:  Positive for back pain.  All other systems reviewed and are negative.   Physical Exam Updated Vital Signs BP 136/80   Pulse (!) 58   Temp 98.2 F (36.8 C) (Oral)   Resp 14   SpO2 99%  Physical Exam Vitals and nursing note reviewed. Exam conducted with a chaperone present.  Constitutional:      General: He is not in acute distress.    Appearance: Normal appearance.  HENT:     Head: Normocephalic and atraumatic.     Mouth/Throat:     Mouth: Mucous membranes are moist.  Eyes:     Conjunctiva/sclera: Conjunctivae normal.     Pupils: Pupils are equal, round, and reactive to light.  Cardiovascular:     Rate and Rhythm: Normal rate and regular rhythm.     Pulses: Normal pulses.     Heart sounds: Normal heart sounds.  Pulmonary:     Effort: Pulmonary effort is normal.     Breath sounds: Normal breath sounds.  Abdominal:     Palpations: Abdomen is  soft.     Tenderness: There is no abdominal tenderness.  Genitourinary:    Penis: Normal.      Testes: Normal.     Comments: No testicular tenderness to palpation Musculoskeletal:        General: Tenderness present. No swelling or deformity.     Right lower leg: No edema.     Left lower leg: No edema.     Comments: Tenderness to palpation of the flanks bilaterally without midline tenderness to the spine. Pain with flexion of the hips bilaterally, with left worse than right.  Skin:    General: Skin is warm and dry.     Findings: No rash.  Neurological:     General: No focal deficit present.     Mental Status: He is alert.  Psychiatric:        Mood and Affect: Mood normal.        Behavior: Behavior normal.    ED Results / Procedures / Treatments    Labs (all labs ordered are listed, but only abnormal results are displayed) Labs Reviewed  COMPREHENSIVE METABOLIC PANEL - Abnormal; Notable for the following components:      Result Value   Glucose, Bld 180 (*)    Creatinine, Ser 1.25 (*)    All other components within normal limits  CBC - Abnormal; Notable for the following components:   Platelets 123 (*)    All other components within normal limits  URINALYSIS, ROUTINE W REFLEX MICROSCOPIC - Abnormal; Notable for the following components:   Specific Gravity, Urine >1.046 (*)    Protein, ur 30 (*)    All other components within normal limits    EKG None  Radiology DG Femur Min 2 Views Right Result Date: 06/08/2023 CLINICAL DATA:  pain.  Pelvic pain. EXAM: RIGHT FEMUR 2 VIEWS; LEFT FEMUR 2 VIEWS COMPARISON:  None Available. FINDINGS: No acute fracture or dislocation. No aggressive osseous lesion. There are mild degenerative changes of bilateral hip joints without significant joint space narrowing. Osteophytosis of the superior acetabulum. The knee joints appear within normal limits. No significant arthritis. No focal soft tissue swelling. No radiopaque foreign bodies. IMPRESSION: *No acute osseous abnormality of bilateral femur. Electronically Signed   By: Ree Molt M.D.   On: 06/08/2023 15:46   DG Femur Min 2 Views Left Result Date: 06/08/2023 CLINICAL DATA:  pain.  Pelvic pain. EXAM: RIGHT FEMUR 2 VIEWS; LEFT FEMUR 2 VIEWS COMPARISON:  None Available. FINDINGS: No acute fracture or dislocation. No aggressive osseous lesion. There are mild degenerative changes of bilateral hip joints without significant joint space narrowing. Osteophytosis of the superior acetabulum. The knee joints appear within normal limits. No significant arthritis. No focal soft tissue swelling. No radiopaque foreign bodies. IMPRESSION: *No acute osseous abnormality of bilateral femur. Electronically Signed   By: Ree Molt M.D.   On: 06/08/2023 15:46    CT ABDOMEN PELVIS W CONTRAST Result Date: 06/08/2023 CLINICAL DATA:  Abdominal pain, acute, nonlocalized lower abdominal pain history of testicular carcinoma. * Tracking Code: BO * EXAM: CT ABDOMEN AND PELVIS WITH CONTRAST TECHNIQUE: Multidetector CT imaging of the abdomen and pelvis was performed using the standard protocol following bolus administration of intravenous contrast. RADIATION DOSE REDUCTION: This exam was performed according to the departmental dose-optimization program which includes automated exposure control, adjustment of the mA and/or kV according to patient size and/or use of iterative reconstruction technique. CONTRAST:  85mL OMNIPAQUE  IOHEXOL  300 MG/ML  SOLN COMPARISON:  CT scan abdomen and pelvis  from 09/13/2022. FINDINGS: Lower chest: The lung bases are clear. No pleural effusion. The heart is normal in size. No pericardial effusion. Hepatobiliary: The liver is normal in size. Non-cirrhotic configuration. There is marked diffuse hepatic steatosis. Redemonstration of an approximately 2.7 x 3.3 cm heterogeneous hyperattenuating lesion in the central liver, unchanged since multiple prior studies and compatible with a hemangioma. There is also a smaller faintly hyperattenuating lesion in the right hepatic dome (series 2, image 13), which is also unchanged since the prior study and favored to represent a hemangioma as well. No intrahepatic or extrahepatic bile duct dilation. No calcified gallstones. Normal gallbladder wall thickness. No pericholecystic inflammatory changes. Pancreas: Unremarkable. No pancreatic ductal dilatation or surrounding inflammatory changes. Spleen: Within normal limits. No focal lesion. Adrenals/Urinary Tract: Adrenal glands are unremarkable. No suspicious renal mass. No hydronephrosis. No renal or ureteric calculi. Unremarkable urinary bladder. Stomach/Bowel: There is small diverticulum arising from the second part of duodenum. No disproportionate dilation of the  small or large bowel loops. No evidence of abnormal bowel wall thickening or inflammatory changes. The appendix is unremarkable. There are multiple diverticula mainly in the sigmoid colon, without imaging signs of diverticulitis. Vascular/Lymphatic: No ascites or pneumoperitoneum. No abdominal or pelvic lymphadenopathy, by size criteria. No aneurysmal dilation of the major abdominal arteries. There are mild peripheral atherosclerotic vascular calcifications of the aorta and its major branches. Reproductive: Normal size prostate. Symmetric seminal vesicles. Note is made of right orchiectomy and prosthetic right testicle. Other: There is a tiny fat containing umbilical hernia. The soft tissues and abdominal wall are otherwise unremarkable. Musculoskeletal: No suspicious osseous lesions. There are mild multilevel degenerative changes in the visualized spine. IMPRESSION: 1. No acute inflammatory process identified within the abdomen or pelvis. 2. No metastatic disease identified within the abdomen or pelvis. 3. Multiple other nonacute observations, as described above. Electronically Signed   By: Ree Molt M.D.   On: 06/08/2023 15:44    Procedures Procedures: not indicated.   Medications Ordered in ED Medications  iohexol  (OMNIPAQUE ) 300 MG/ML solution 100 mL (85 mLs Intravenous Contrast Given 06/08/23 1408)  naproxen  (NAPROSYN ) tablet 500 mg (500 mg Oral Given 06/08/23 1514)    ED Course/ Medical Decision Making/ A&P                                 Medical Decision Making Amount and/or Complexity of Data Reviewed Labs: ordered. Radiology: ordered.  Risk Prescription drug management.   This patient presents to the ED for concern of hip pain, this involves an extensive number of treatment options, and is a complaint that carries with it a high risk of complications and morbidity.   Differential diagnosis includes: cancer metastasis, electrolyte derangement, arthritis, fracture, dislocation,  contusion, etc.   Comorbidities  See HPI above   Additional History  Additional history obtained from prior records.   Lab Tests  I ordered and personally interpreted labs.  The pertinent results include:   CMP and CBC are within normal limits - no acute infection, anemia, AKI, or electrolyte derangement UA is normal - no signs of acute infection.   Imaging Studies  I ordered imaging studies including CT abdomen/pelvis and bilateral femur x-rays  I independently visualized and interpreted imaging which showed:  No acute osseous abnormalities of the bilateral femurs. Mild degenerative changes of the bilateral hip joint  without significant joint space narrowing. Osteophytosis of the superior acetabulum. No acute inflammatory processes identified  within the abdomen or pelvis. No metastatic disease identified within the abdomen or pelvis.   I agree with the radiologist interpretation   Problem List / ED Course / Critical Interventions / Medication Management  Low back with bilateral hip, groin, and medial thigh pain x2 months without initial injury or trauma. I ordered medications including: Naproxen  for pain  Reevaluation of the patient after these medicines showed that the patient improve some. I have reviewed the patients home medicines and have made adjustments as needed. Discussed patient with my attending, Dr. Franklyn, who assessed patient as well. She agrees that patient is safe for discharge home.  He has had better improvement of symptoms with meloxicam  than ibuprofen.  Prescription of meloxicam  sent to the pharmacy to take as needed for symptoms. Ambulatory referral for primary care provided.   Social Determinants of Health  Transportation    Test / Admission - Considered  Discussed findings with patient. All questions answered. He is stable and safe for discharge home. Return precautions given.        Final Clinical Impression(s) / ED Diagnoses Final  diagnoses:  Bilateral hip pain  Bilateral groin pain    Rx / DC Orders ED Discharge Orders          Ordered    meloxicam  (MOBIC ) 15 MG tablet  Daily PRN        06/08/23 1808    Ambulatory referral to [facility]        06/08/23 1808              Waddell Sluder, PA-C 06/08/23 1813    Emil Share, DO 06/12/23 [phone]

## 2023-06-08 NOTE — Discharge Instructions (Addendum)
 As discussed, your labs and imaging are reassuring. I have sent a prescription of Meloxicam  to your pharmacy that you can take once a day as needed for pain.  Additionally you can alternate between Ibuprofen and Tylenol  every 4 hours as needed.  I have sent an ambulatory referral to family practice, so you can establish care with a primary care provider.  They should call you in the next several days to make an appointment.  Additionally, you can follow-up with your orthopedic provider for further evaluation.  Get help right away if: You fall. You have a sudden increase in pain and swelling in your hip. Your hip is red or swollen or very tender to touch.

## 2023-06-08 NOTE — ED Triage Notes (Signed)
 Pt c/o pelvic pain, "from groin to hip joins/ sockets, to inside of my thighs, lower back" onset approx 2mos ago. Advises pain is sharp/ stabbing, worse w movement. No urinary/ other symptoms

## 2023-12-14 LAB — COMPREHENSIVE METABOLIC PANEL WITH GFR: EGFR: 93.8

## 2024-03-01 ENCOUNTER — Other Ambulatory Visit (HOSPITAL_BASED_OUTPATIENT_CLINIC_OR_DEPARTMENT_OTHER): Payer: Self-pay

## 2024-03-01 MED ORDER — QUVIVIQ 50 MG PO TABS
50.0000 mg | ORAL_TABLET | Freq: Every day | ORAL | 0 refills | Status: AC
  Filled 2024-03-01: qty 90, 90d supply, fill #0

## 2024-03-02 ENCOUNTER — Other Ambulatory Visit (HOSPITAL_BASED_OUTPATIENT_CLINIC_OR_DEPARTMENT_OTHER): Payer: Self-pay

## 2024-06-08 ENCOUNTER — Ambulatory Visit: Admitting: Student in an Organized Health Care Education/Training Program

## 2024-06-08 ENCOUNTER — Encounter: Payer: Self-pay | Admitting: Student in an Organized Health Care Education/Training Program

## 2024-06-08 VITALS — BP 131/82 | HR 68 | Temp 98.1°F | Ht 67.0 in | Wt 170.3 lb

## 2024-06-08 DIAGNOSIS — F5101 Primary insomnia: Secondary | ICD-10-CM | POA: Insufficient documentation

## 2024-06-08 DIAGNOSIS — Z23 Encounter for immunization: Secondary | ICD-10-CM | POA: Diagnosis not present

## 2024-06-08 DIAGNOSIS — Z1159 Encounter for screening for other viral diseases: Secondary | ICD-10-CM

## 2024-06-08 DIAGNOSIS — Z114 Encounter for screening for human immunodeficiency virus [HIV]: Secondary | ICD-10-CM

## 2024-06-08 DIAGNOSIS — E291 Testicular hypofunction: Secondary | ICD-10-CM | POA: Diagnosis not present

## 2024-06-08 DIAGNOSIS — C6292 Malignant neoplasm of left testis, unspecified whether descended or undescended: Secondary | ICD-10-CM | POA: Diagnosis not present

## 2024-06-08 DIAGNOSIS — F1090 Alcohol use, unspecified, uncomplicated: Secondary | ICD-10-CM | POA: Diagnosis not present

## 2024-06-08 DIAGNOSIS — R945 Abnormal results of liver function studies: Secondary | ICD-10-CM | POA: Insufficient documentation

## 2024-06-08 DIAGNOSIS — Z13220 Encounter for screening for lipoid disorders: Secondary | ICD-10-CM

## 2024-06-08 DIAGNOSIS — Z131 Encounter for screening for diabetes mellitus: Secondary | ICD-10-CM | POA: Diagnosis not present

## 2024-06-08 LAB — COMPREHENSIVE METABOLIC PANEL WITH GFR
ALT: 48 U/L (ref 3–53)
AST: 45 U/L — ABNORMAL HIGH (ref 5–37)
Albumin: 4.4 g/dL (ref 3.5–5.2)
Alkaline Phosphatase: 67 U/L (ref 39–117)
BUN: 12 mg/dL (ref 6–23)
CO2: 26 meq/L (ref 19–32)
Calcium: 9.5 mg/dL (ref 8.4–10.5)
Chloride: 104 meq/L (ref 96–112)
Creatinine, Ser: 1.15 mg/dL (ref 0.40–1.50)
GFR: 70.03 mL/min
Glucose, Bld: 93 mg/dL (ref 70–99)
Potassium: 4 meq/L (ref 3.5–5.1)
Sodium: 137 meq/L (ref 135–145)
Total Bilirubin: 0.6 mg/dL (ref 0.2–1.2)
Total Protein: 7.1 g/dL (ref 6.0–8.3)

## 2024-06-08 LAB — CBC
HCT: 46.6 % (ref 39.0–52.0)
Hemoglobin: 15.9 g/dL (ref 13.0–17.0)
MCHC: 34.1 g/dL (ref 30.0–36.0)
MCV: 87.5 fl (ref 78.0–100.0)
Platelets: 133 K/uL — ABNORMAL LOW (ref 150.0–400.0)
RBC: 5.33 Mil/uL (ref 4.22–5.81)
RDW: 13.6 % (ref 11.5–15.5)
WBC: 6.6 K/uL (ref 4.0–10.5)

## 2024-06-08 LAB — IBC + FERRITIN
Ferritin: 348.6 ng/mL — ABNORMAL HIGH (ref 22.0–322.0)
Iron: 123 ug/dL (ref 42–165)
Saturation Ratios: 33.2 % (ref 20.0–50.0)
TIBC: 371 ug/dL (ref 250.0–450.0)
Transferrin: 265 mg/dL (ref 212.0–360.0)

## 2024-06-08 LAB — VITAMIN B12: Vitamin B-12: 531 pg/mL (ref 211–911)

## 2024-06-08 LAB — LIPID PANEL
Cholesterol: 191 mg/dL (ref 28–200)
HDL: 58.7 mg/dL
LDL Cholesterol: 112 mg/dL — ABNORMAL HIGH (ref 10–99)
NonHDL: 132.7
Total CHOL/HDL Ratio: 3
Triglycerides: 105 mg/dL (ref 10.0–149.0)
VLDL: 21 mg/dL (ref 0.0–40.0)

## 2024-06-08 LAB — TSH: TSH: 1.7 u[IU]/mL (ref 0.35–5.50)

## 2024-06-08 LAB — HEMOGLOBIN A1C: Hgb A1c MFr Bld: 5.3 % (ref 4.6–6.5)

## 2024-06-08 LAB — TESTOSTERONE: Testosterone: 315.89 ng/dL (ref 300.00–890.00)

## 2024-06-08 NOTE — Assessment & Plan Note (Signed)
 Chronic and stable issue.  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  This continues to be managed by Dr. Lonn with oncology, and he continues to follow-up with his urologist.

## 2024-06-08 NOTE — Patient Instructions (Signed)
" °  VISIT SUMMARY: During your visit, we discussed your concerns about liver function abnormalities, fatigue, and other health issues. We reviewed your history of testicular cancer, insomnia, and musculoskeletal pain. We also addressed your alcohol  consumption and its potential impact on your liver health.  YOUR PLAN: -FATTY LIVER DISEASE WITH ABNORMAL LIVER FUNCTION TESTS: Fatty liver disease occurs when fat builds up in the liver, which can lead to liver damage. Your liver enzymes are mildly elevated, possibly due to alcohol  consumption. We have ordered blood work to assess your liver function and recommend reducing your alcohol  intake to no more than two drinks per day.  -CHRONIC INSOMNIA: Chronic insomnia is a condition where you have trouble falling or staying asleep over a long period. You should continue taking Quviviq  and trazodone as prescribed by your psychiatrist.  -ALCOHOL  USE ABOVE RECOMMENDED LIMITS: Consuming alcohol  above recommended limits can lead to various health issues, including liver damage. You currently consume six beers three days a week, which exceeds the recommended limits. We discussed the potential health risks and advised reducing your alcohol  consumption to no more than two drinks per day.  -CHRONIC MUSCULOSKELETAL PAIN AND PRE-ARTHRITIC JOINT PAIN: Chronic musculoskeletal pain and pre-arthritic joint pain refer to ongoing pain in your muscles and joints that may lead to arthritis. You should continue taking meloxicam  as needed for pain management, but not more than two to three times per week.  -HISTORY OF RIGHT TESTICULAR SEMINOMA, STATUS POST ORCHIECTOMY AND CHEMOTHERAPY: You have a history of right testicular seminoma, a type of testicular cancer, which was treated with surgery and chemotherapy.  -GENERAL HEALTH MAINTENANCE: It is important to monitor your liver function and overall health, especially due to your history of fatty liver and alcohol  use. We have ordered  blood work to assess your liver function and other potential causes of fatigue. We will review the results and assess your progress at your follow-up appointment in one to two months.  INSTRUCTIONS: Please follow up in one to two months to review your blood work and assess your progress. Continue taking your medications as prescribed and reduce your alcohol  intake to no more than two drinks per day.  "

## 2024-06-08 NOTE — Assessment & Plan Note (Signed)
 Patient with a history of elevated AST and ALT and thrombocytopenia in the setting of heavy alcohol  use.  He is showing stigmata of chronic liver disease on exam with gynecomastia, telangiectasias.  Will check labs today, will rule out hemochromatosis and viral hepatitis as contributors.  In his case I think alcohol  cessation would be the most effective at managing his liver abnormalities.  I recommended cutting back on alcohol  use, he is going to try over the next few weeks, can start naltrexone in the future if needed.  Will use labs to evaluate for advanced fibrosis, based on the fib 4 score may consider ultrasound with elastography.

## 2024-06-08 NOTE — Assessment & Plan Note (Signed)
 Moderate chronic alcohol  use disorder, no signs of dependence or withdrawal, currently drinking on average 18 drinks per week.  Also starting to show signs of chronic liver disease.  At this point I recommended decreasing alcohol  use in order to benefit his overall health.  I recommended no more than 2 drinks in 1 sitting.  Depending on how his liver enzymes look in the future, may have to recommend complete abstinence.  Patient set a goal to reduce from 6 drinks in a sitting to 3 drinks at a sitting and I want to follow-up with him closely in 6 weeks.  If he has struggles with cravings, will offer him naltrexone.

## 2024-06-08 NOTE — Assessment & Plan Note (Signed)
 This is being managed by Dr. Vincente with psychiatry.  Currently he is using a combination of trazodone and Quviviq  with okay effectiveness.  Still has a lot of somnolence during the day that can be function limiting at times.  Unclear to me how much mood disorder is impacting this, they have tried different mood stabilizers in the past without much effectiveness.  I also wonder how alcohol  use disorder is disrupting his sleep.  Clearly will not be an easy solution to this problem.

## 2024-06-08 NOTE — Assessment & Plan Note (Signed)
 Labs at [facility] urology showed moderate decreased testosterone  at 252.  Will check this again today.  He also has significant fatigue.  At risk for primary testicular failure in the setting of a single orchiectomy, now also showing signs of liver disease due to heavy alcohol  use.  Etiology of the low testosterone  and high estradiol is likely multifactorial, but were going to take a few weeks and really work on cutting back alcohol  and I think this might improve the issues.  If testosterone  remains low, I can offer him TRT.

## 2024-06-08 NOTE — Progress Notes (Signed)
 "  New Patient Office Visit  Patient ID: [name], Male   DOB: February 11, 1966 59 y.o. MRN: [id number]  Chief Complaint  Patient presents with   New Patient (Initial Visit)    Right side torso pain, joint in pelvis pain, fatigue, weight gain, memory loss   Subjective:     [name] presents to establish care  HPI  Discussed the use of AI scribe software for clinical note transcription with the patient, who gave verbal consent to proceed.  History of Present Illness The patient, with a history of testicular cancer, presents with concerns about liver function abnormalities and fatigue.  Recent tests ordered by his urologist showed elevated liver enzymes, low testosterone , and high estrogen levels. He has a history of testicular cancer diagnosed in 2016, for which he underwent orchiectomy and chemotherapy. He has been followed by a urologist since then.  He experiences significant fatigue and exhaustion, described as 'unbearable', worsening over the past few months. He has a long-standing history of insomnia, currently taking Quviviq  and trazodone 100 mg. Quviviq  initially helped, but its effectiveness has decreased recently. He attributes his symptoms to chronic fatigue.  He reports mid-back pain persisting for a few months, located in the flank area, without any known injury. He denies any history of trauma or falls.  He has a history of fatty liver diagnosed prior to his cancer diagnosis. He reports a weight gain of 17 pounds over the past six months despite efforts to control his diet. He previously weighed more but currently weighs 170 pounds with a BMI of 26.  He describes his skin as thin and prone to bruising, particularly on his arms, likening it to 'a ninety-year-old's skin.' He denies prolonged use of steroids but has had short courses of prednisone  in the past.  He has a history of neck and shoulder surgeries, including a C6-C7 disc replacement and acromion resectioning. He  reports joint pain and discomfort, which limits his ability to exercise, including walking his dogs.  He underwent a sleep study which ruled out sleep apnea, although he reports significant snoring. He consumes approximately 18 beers per week, typically six beers on three days, and denies any health, relationship, or work problems related to alcohol  use.   Outpatient Encounter Medications as of 06/08/2024  Medication Sig   Daridorexant  HCl (QUVIVIQ ) 50 MG TABS Take 1 tablet (50 mg total) by mouth at bedtime.   finasteride (PROPECIA) 1 MG tablet Take 1 mg by mouth daily.   traZODone (DESYREL) 100 MG tablet Take 100 mg by mouth at bedtime.   [DISCONTINUED] ALPRAZolam (XANAX) 1 MG tablet Take 1 mg by mouth 3 (three) times daily as needed for sleep.   [DISCONTINUED] ciprofloxacin  (CIPRO ) 500 MG tablet Take 1 tablet (500 mg total) by mouth 2 (two) times daily.   [DISCONTINUED] Dextromethorphan-Bupropion (AUVELITY PO) Take 45 mg by mouth.   [DISCONTINUED] guaiFENesin -dextromethorphan (ROBITUSSIN DM) 100-10 MG/5ML syrup Take 5 mLs by mouth every 4 (four) hours as needed for cough.   [DISCONTINUED] metroNIDAZOLE  (FLAGYL ) 500 MG tablet Take 1 tablet (500 mg total) by mouth 3 (three) times daily.   [DISCONTINUED] ofloxacin  (FLOXIN ) 0.3 % OTIC solution Place 10 drops into the left ear daily.   [DISCONTINUED] Vilazodone HCl 20 MG TABS Take 20 mg by mouth daily.   No facility-administered encounter medications on file as of 06/08/2024.    Past Medical History:  Diagnosis Date   Headache    tension h/a's   Heart murmur  Sleep apnea    no cpap, unable to tolerate mask   testicular ca dx'd 07/2014   right   Testicular mass    rt sided   Weakness    arms and legs   Wears glasses    for computer work    Past Surgical History:  Procedure Laterality Date   CERVICAL SPINE SURGERY     w/ plates and screws   ORCHIECTOMY Right 08/01/2014   Procedure: RIGHT RADICAL ORCHIECTOMY WITH PROSTESISPLACEMENT;   Surgeon: Norleen Seltzer, MD;  Location: [facility];  Service: Urology;  Laterality: Right;   SHOULDER ARTHROSCOPY W/ ACROMIAL REPAIR Bilateral     Family History  Problem Relation Age of Onset   Heart disease Father    Healthy Mother        Objective:    BP 131/82   Pulse 68   Temp 98.1 F (36.7 C) (Oral)   Ht 5' 7 (1.702 m)   Wt 170 lb 4.8 oz (77.2 kg)   SpO2 95%   BMI 26.67 kg/m   Physical Exam  Gen: Well-appearing man Ears: Normal hearing, normal tympanic membranes bilaterally Neck: Normal thyroid , no nodules or adenopathy Heart: Regular, no murmur, moderate gynecomastia Abd: Soft, nontender, no organomegaly Skin: Some mild telangiectasias are present on the face Ext: Warm, no edema, normal joints Neuro: Alert, conversational, not sedated, no tremor or asterixis, full strength upper and lower extremities, normal gait and balance      Assessment & Plan:   Problem List Items Addressed This Visit       High   Abnormal liver function - Primary (Chronic)   Patient with a history of elevated AST and ALT and thrombocytopenia in the setting of heavy alcohol  use.  He is showing stigmata of chronic liver disease on exam with gynecomastia, telangiectasias.  Will check labs today, will rule out hemochromatosis and viral hepatitis as contributors.  In his case I think alcohol  cessation would be the most effective at managing his liver abnormalities.  I recommended cutting back on alcohol  use, he is going to try over the next few weeks, can start naltrexone in the future if needed.  Will use labs to evaluate for advanced fibrosis, based on the fib 4 score may consider ultrasound with elastography.      Relevant Orders   CBC   Comprehensive metabolic panel with GFR   Hepatitis C antibody   IBC + Ferritin   Hepatitis B Surface AntiGEN   Hepatitis B surface antibody,qualitative   Hepatitis B Core Antibody, total   Alcohol  use disorder (Chronic)   Moderate  chronic alcohol  use disorder, no signs of dependence or withdrawal, currently drinking on average 18 drinks per week.  Also starting to show signs of chronic liver disease.  At this point I recommended decreasing alcohol  use in order to benefit his overall health.  I recommended no more than 2 drinks in 1 sitting.  Depending on how his liver enzymes look in the future, may have to recommend complete abstinence.  Patient set a goal to reduce from 6 drinks in a sitting to 3 drinks at a sitting and I want to follow-up with him closely in 6 weeks.  If he has struggles with cravings, will offer him naltrexone.      Relevant Orders   Vitamin B12   Primary insomnia (Chronic)   This is being managed by Dr. Vincente with psychiatry.  Currently he is using a combination of trazodone and Quviviq  with  okay effectiveness.  Still has a lot of somnolence during the day that can be function limiting at times.  Unclear to me how much mood disorder is impacting this, they have tried different mood stabilizers in the past without much effectiveness.  I also wonder how alcohol  use disorder is disrupting his sleep.  Clearly will not be an easy solution to this problem.      Relevant Orders   TSH     Medium    Testis cancer (HCC) (Chronic)   Chronic and stable issue.  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  This continues to be managed by Dr. Lonn with oncology, and he continues to follow-up with his urologist.      Primary testicular failure (Chronic)   Labs at [facility] urology showed moderate decreased testosterone  at 252.  Will check this again today.  He also has significant fatigue.  At risk for primary testicular failure in the setting of a single orchiectomy, now also showing signs of liver disease due to heavy alcohol  use.  Etiology of the low testosterone  and high estradiol is likely multifactorial, but were going to take a few weeks and really work on  cutting back alcohol  and I think this might improve the issues.  If testosterone  remains low, I can offer him TRT.      Relevant Orders   Testosterone    Other Visit Diagnoses       Screening for lipid disorders       Relevant Orders   Lipid panel     Screening for diabetes mellitus       Relevant Orders   Hemoglobin A1c     Encounter for HCV screening test for low risk patient       Relevant Orders   Hepatitis C antibody     Screening for HIV (human immunodeficiency virus)       Relevant Orders   HIV Antibody (routine testing w rflx)     Immunization due       Relevant Orders   Flu vaccine trivalent PF, 6mos and older(Flulaval,Afluria,Fluarix,Fluzone) (Completed)       Return in about 6 weeks (around 07/20/2024).   Cleatus Debby Specking, MD  [facility]     "

## 2024-06-09 ENCOUNTER — Ambulatory Visit: Payer: Self-pay | Admitting: Student in an Organized Health Care Education/Training Program

## 2024-06-09 DIAGNOSIS — R945 Abnormal results of liver function studies: Secondary | ICD-10-CM

## 2024-06-09 LAB — HEPATITIS B CORE ANTIBODY, TOTAL: Hep B Core Total Ab: NONREACTIVE

## 2024-06-09 LAB — HEPATITIS C ANTIBODY: Hepatitis C Ab: NONREACTIVE

## 2024-06-09 LAB — HEPATITIS B SURFACE ANTIBODY,QUALITATIVE: Hep B S Ab: NONREACTIVE

## 2024-06-09 LAB — HIV ANTIBODY (ROUTINE TESTING W REFLEX)
HIV 1&2 Ab, 4th Generation: NONREACTIVE
HIV FINAL INTERPRETATION: NEGATIVE

## 2024-06-09 LAB — HEPATITIS B SURFACE ANTIGEN: Hepatitis B Surface Ag: NONREACTIVE

## 2024-06-16 ENCOUNTER — Ambulatory Visit: Admitting: Student in an Organized Health Care Education/Training Program

## 2024-06-20 ENCOUNTER — Other Ambulatory Visit

## 2024-06-28 ENCOUNTER — Ambulatory Visit
Admission: RE | Admit: 2024-06-28 | Discharge: 2024-06-28 | Disposition: A | Source: Ambulatory Visit | Attending: Student in an Organized Health Care Education/Training Program

## 2024-06-28 DIAGNOSIS — R945 Abnormal results of liver function studies: Secondary | ICD-10-CM

## 2024-07-01 ENCOUNTER — Ambulatory Visit: Payer: Self-pay | Admitting: Student in an Organized Health Care Education/Training Program

## 2024-07-14 ENCOUNTER — Ambulatory Visit: Admitting: Family Medicine
